# Patient Record
Sex: Female | Born: 1950 | Race: White | Hispanic: No | Smoking: Current every day smoker
Health system: Southern US, Community
[De-identification: ages and names within clinical notes are randomized; demographics above are authoritative.]

## PROBLEM LIST (undated history)

## (undated) DIAGNOSIS — I2510 Atherosclerotic heart disease of native coronary artery without angina pectoris: Secondary | ICD-10-CM

## (undated) DIAGNOSIS — I671 Cerebral aneurysm, nonruptured: Secondary | ICD-10-CM

## (undated) DIAGNOSIS — K219 Gastro-esophageal reflux disease without esophagitis: Secondary | ICD-10-CM

## (undated) DIAGNOSIS — I10 Essential (primary) hypertension: Secondary | ICD-10-CM

## (undated) DIAGNOSIS — R0600 Dyspnea, unspecified: Secondary | ICD-10-CM

## (undated) DIAGNOSIS — M069 Rheumatoid arthritis, unspecified: Secondary | ICD-10-CM

## (undated) DIAGNOSIS — I200 Unstable angina: Secondary | ICD-10-CM

## (undated) DIAGNOSIS — I314 Cardiac tamponade: Secondary | ICD-10-CM

## (undated) DIAGNOSIS — Z9641 Presence of insulin pump (external) (internal): Secondary | ICD-10-CM

## (undated) DIAGNOSIS — F419 Anxiety disorder, unspecified: Secondary | ICD-10-CM

## (undated) DIAGNOSIS — L97509 Non-pressure chronic ulcer of other part of unspecified foot with unspecified severity: Secondary | ICD-10-CM

## (undated) DIAGNOSIS — I509 Heart failure, unspecified: Secondary | ICD-10-CM

## (undated) DIAGNOSIS — F32A Depression, unspecified: Secondary | ICD-10-CM

## (undated) DIAGNOSIS — K759 Inflammatory liver disease, unspecified: Secondary | ICD-10-CM

## (undated) DIAGNOSIS — E119 Type 2 diabetes mellitus without complications: Secondary | ICD-10-CM

## (undated) DIAGNOSIS — G709 Myoneural disorder, unspecified: Secondary | ICD-10-CM

## (undated) DIAGNOSIS — M86172 Other acute osteomyelitis, left ankle and foot: Secondary | ICD-10-CM

## (undated) HISTORY — PX: CARDIAC CATHETERIZATION: SHX172

## (undated) HISTORY — PX: TONSILLECTOMY: SUR1361

## (undated) HISTORY — PX: COLONOSCOPY: SHX174

## (undated) HISTORY — PX: PARTIAL HYSTERECTOMY: SHX80

---

## 1998-04-05 ENCOUNTER — Ambulatory Visit (HOSPITAL_COMMUNITY): Admission: RE | Admit: 1998-04-05 | Discharge: 1998-04-05 | Payer: Self-pay | Admitting: Gastroenterology

## 1999-04-26 ENCOUNTER — Emergency Department (HOSPITAL_COMMUNITY): Admission: EM | Admit: 1999-04-26 | Discharge: 1999-04-26 | Payer: Self-pay | Admitting: Emergency Medicine

## 1999-05-17 ENCOUNTER — Encounter: Payer: Self-pay | Admitting: Gastroenterology

## 1999-05-17 ENCOUNTER — Ambulatory Visit (HOSPITAL_COMMUNITY): Admission: RE | Admit: 1999-05-17 | Discharge: 1999-05-17 | Payer: Self-pay | Admitting: Gastroenterology

## 1999-06-06 ENCOUNTER — Ambulatory Visit (HOSPITAL_COMMUNITY): Admission: RE | Admit: 1999-06-06 | Discharge: 1999-06-06 | Payer: Self-pay | Admitting: Gastroenterology

## 2000-04-26 ENCOUNTER — Emergency Department (HOSPITAL_COMMUNITY): Admission: EM | Admit: 2000-04-26 | Discharge: 2000-04-26 | Payer: Self-pay | Admitting: Internal Medicine

## 2000-04-26 ENCOUNTER — Encounter: Payer: Self-pay | Admitting: Internal Medicine

## 2002-10-23 ENCOUNTER — Encounter: Payer: Self-pay | Admitting: Family Medicine

## 2002-10-23 ENCOUNTER — Ambulatory Visit (HOSPITAL_COMMUNITY): Admission: RE | Admit: 2002-10-23 | Discharge: 2002-10-23 | Payer: Self-pay | Admitting: Family Medicine

## 2005-02-28 ENCOUNTER — Ambulatory Visit: Payer: Self-pay | Admitting: Family Medicine

## 2005-04-11 ENCOUNTER — Ambulatory Visit: Payer: Self-pay | Admitting: Family Medicine

## 2005-05-09 ENCOUNTER — Ambulatory Visit: Payer: Self-pay | Admitting: Family Medicine

## 2005-07-06 ENCOUNTER — Ambulatory Visit: Payer: Self-pay | Admitting: Family Medicine

## 2005-08-17 ENCOUNTER — Ambulatory Visit: Payer: Self-pay | Admitting: Family Medicine

## 2005-08-17 ENCOUNTER — Ambulatory Visit (HOSPITAL_COMMUNITY): Admission: RE | Admit: 2005-08-17 | Discharge: 2005-08-17 | Payer: Self-pay | Admitting: Family Medicine

## 2013-05-19 ENCOUNTER — Ambulatory Visit (INDEPENDENT_AMBULATORY_CARE_PROVIDER_SITE_OTHER): Payer: Medicare Other

## 2013-05-19 DIAGNOSIS — R209 Unspecified disturbances of skin sensation: Secondary | ICD-10-CM

## 2013-05-19 NOTE — Procedures (Signed)
  HISTORY:  Nichole Santiago is a 62 year old patient with a history of a stroke of associated with left-sided numbness. The patient has paresthesias as well in the legs, and a history of diabetes. The patient is being evaluated for a possible peripheral neuropathy.   NERVE CONDUCTION STUDIES:  Nerve conduction studies were performed on both upper extremities. The distal motor latencies and motor amplitudes for the median and ulnar nerves were within normal limits. The F wave latencies and nerve conduction velocities for these nerves were also normal. The sensory latencies for the median and ulnar nerves were normal.  Nerve conduction studies were performed on the left lower extremity. The distal motor latencies and motor amplitudes for the peroneal and posterior tibial nerves were within normal limits. The nerve conduction velocities for these nerves were also normal. The H reflex latency was normal. The sensory latency for the peroneal nerve was within normal limits.   EMG STUDIES:  EMG evaluation was not performed.  IMPRESSION:  Nerve conduction studies done on both upper extremities and on the left lower extremity were unremarkable. There is no evidence of a peripheral neuropathy. A small fiber neuropathy however, may be missed by a standard nerve conduction studies. Clinical correlation is required. EMG evaluation was not performed.  Marlan Palau MD 05/19/2013 2:35 PM  [facility] [address] [place], [place] [address]  Phone [phone] Fax [phone]

## 2015-09-12 HISTORY — PX: CEREBRAL ANEURYSM REPAIR: SHX164

## 2017-12-25 ENCOUNTER — Ambulatory Visit: Payer: Medicare Other | Admitting: Internal Medicine

## 2018-02-01 ENCOUNTER — Encounter

## 2018-02-01 ENCOUNTER — Ambulatory Visit: Payer: Medicare Other | Admitting: Internal Medicine

## 2018-02-01 ENCOUNTER — Encounter: Payer: Self-pay | Admitting: Internal Medicine

## 2018-02-01 VITALS — BP 90/58 | HR 82 | Ht 61.0 in | Wt 133.4 lb

## 2018-02-01 DIAGNOSIS — E1059 Type 1 diabetes mellitus with other circulatory complications: Secondary | ICD-10-CM | POA: Diagnosis not present

## 2018-02-01 DIAGNOSIS — E1065 Type 1 diabetes mellitus with hyperglycemia: Secondary | ICD-10-CM

## 2018-02-01 DIAGNOSIS — IMO0002 Reserved for concepts with insufficient information to code with codable children: Secondary | ICD-10-CM | POA: Insufficient documentation

## 2018-02-01 MED ORDER — GLUCAGON (RDNA) 1 MG IJ KIT: 1.0000 mg | PACK | Freq: Once | INTRAMUSCULAR | 12 refills | Status: DC | PRN

## 2018-02-01 NOTE — Patient Instructions (Addendum)
Please change the pump settings as follows (while on Prednisone) - basal rates: 12 am: 0.6 units/h 5 pm: 0.6 >> 0.8 - ICR: 1:16 >> 1:12 - target: 100-120 - ISF: 32 (you may need to decrease to 25 if sugars are not improved) - Insulin on Board: 4h - bolus wizard: on  Please enter: sugars, carbs and start the insulin bolus 15 min before every meals.   Please schedule an appt with Oran Rein with nutrition.  Please come back for a follow-up appointment in 3 months.  Basic Rules for Patients with Type I Diabetes Mellitus  1. The American Diabetes Association (ADA) recommended targets: - fasting sugar <130 - after meal sugar <180 - HbA1C <7%  2. Engage in ?150 min moderate exercise per week  3. Make sure you have ?8h of sleep every night as this helps both blood sugars and your weight.  4. Always keep a sugar log (not only record in your meter) and bring it to all appointments with [place].  5. If you are on a pump, know how to access the settings and to modify the parameters.  6.  Remember, you can always call the number on the back of the pump for emergencies related to the pump.  7. "15-15 rule" for hypoglycemia: if sugars are low, take 15 g of carbs** ("fast sugar" - e.g. 4 glucose tablets, 4 oz orange juice), wait 15 min, then check sugars again. If still <80, repeat. Continue  until your sugars >80, then eat a normal meal.   8. Teach family members and coworkers to inject glucagon. Have a glucagon set at home and one at work. They should call 911 after using the set.  9. If you are on a pump, set "insulin on board" time for 5 hours (if your sugars tend to be higher, can use 4 hours).   10. If you are on a pump, use the "dual wave bolus" setting for high fat foods (e.g. pizza). Start with a setting of 50%-50% (50% instant bolus and 50% prolonged bolus over 3h, for e.g.).    11. If you are on a pump, make sure the basal daily insulin dose is approximately equal (not larger) to  the daily insulin you get from boluses, otherwise you are at risk for hypoglycemia.  12. Check sugar before driving. If <100, correct, and only start driving if sugars rise ?786. Check sugar every hour when on a long drive.  13. Check sugar before exercising. If <100, correct, and only start exercising if sugars rise ?100. Check sugar every hour when on a long exercise routine and 1h after you finished exercising.   If >250, check urine for ketones. If you have moderate-large ketones in urine, do not start exercise. Hydrate yourself with clear liquids and correct the high sugar. Recheck sugars and ketones before attempting to exercise.  Be aware that you might need less insulin when exercising.  *intense, short, exercise bursts can increase your sugars, but  *less intense, longer (>1h), exercise routines can decrease your sugars.  If you are on a pump, you might need to decrease your basal rate by 10% or more (or even disconnect your pump) while you exercise to prevent low sugars. Do not disconnect your pump by more than 3 hours at a time! You also might need to decrease your insulin bolus for the meal prior to your exercise time by 20% or more.  14. Make sure you have a MedAlert bracelet or pendant mentioning "Type  I Diabetes Mellitus". If you have a prior episode of severe hypoglycemia or hypoglycemia unawareness, it should also mention this.  15. Please do not walk barefoot. Inspect your feet for sores/cuts and let us know if you have them.  16. Please call [facility] with any questions and concerns [phone]).   **E.g. of "fast carbs": ? first choice (15 g):  1 tube glucose gel, GlucoPouch 15, 2 oz glucose liquid ? second choice (15-16 g):  3 or 4 glucose tablets (best taken  with water), 15 Dextrose Bits chewable ? third choice (15-20 g):   cup fruit juice,  cup regular soda, 1 cup skim milk,  1 cup sports drink ? fourth choice (15-20 g):  1 small tube [place]  gel (not frosting), 2 tbsp raisins, 1 tbsp table sugar,  candy, jelly beans, gum drops - check package for carb amount   (adapted from: Juluis Rainier. "Insulin therapy and hypoglycemia" Endocrinol Metab Clin N Am 2012, 41: 57-87)

## 2018-02-01 NOTE — Progress Notes (Signed)
Patient ID: Nichole Santiago, female   DOB: 01-16-1951, 67 y.o.   MRN: [id number]   HPI: Nichole Santiago is a 67 y.o.-year-old female, referred by her PCP, Dr. 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  She previously saw Dr. Steffanie Dunn and then Dr. Francetta Found.  Last visit with him in 09/2017.  She is here with her daughter who offers part of the history especially regarding patient's symptoms and pump management.  Last hemoglobin A1c was: 01/30/2018: HbA1c 8.9% 09/19/2017: HbA1c 10% No results found for: HGBA1C  Pt is on an insulin pump - intiially t:slim, now Omnipod  - since Spring 2018, with a Dexcom CGM 5, uses Humalog in the pump.  Currently on 10 mg Prednisone for RA >> may stop in 02/2018, but unclear  Pump settings: - basal rates: 12 am: 0.6 units/h - ICR: 1:16 - target: 100-120 - ISF: 32 - Insulin on Board: 4h - bolus wizard: on TDD from basal insulin: 13.5 units (35%). TDD from bolus insulin: 24.9 units (65%) TDD: 30 units - extended bolusing: not using - changes infusion site: q3 days  Meter: One Touch Verio  Pt checks her sugars many times with her CGM - am: 172, 238 - 2h after b'fast: 248 - before lunch: 103- 224 - 2h after lunch: 92-297 - before dinner: 106-360, 400 - 2h after dinner: 192-400 - bedtime: 108, 160, 197-367 - nighttime: 71- 311 Lowest sugar was 50 (more carbs entered in the pump that she actually ate); she has hypoglycemia awareness at 60s. No previous hypoglycemia admission.  She does have a glucagon kit at home. Highest sugar was 400 (Prednisone - for RA), OTW 200s . No previous DKA admissions.    Pt's meals are: - Breakfast: sausage biscuit  + coffee - Lunch: sandwich, chips - Dinner: microwave dinner - Snacks: crackers, chips, banana - 3x a day  - no CKD, last  BUN/creatinine:  09/30/2017: 24/0.7, GFR 91 No results found for: BUN, CREATININE  On losartan 25.  -+ HL; last set of lipids: 08/01/2017: 287/189/59/190 06/05/2013: 347/310/74/211 No results found for: CHOL, HDL, LDLCALC, LDLDIRECT, TRIG, CHOLHDL  On Rosuvastatin 40.  - last eye exam was in 2017. No DR. + glaucoma.  - + numbness and tingling in her feet.  Was on Neurontin 300 mg twice a day >> stopped 2/2 dizziness. On ASA 81.  Last TSH: 05/30/2017: TSH 1.47 06/01/2014: TSH 0.954 No results found for: TSH  Pt has FH of DM in maternal grandmother.  She also has a history of RA.  ROS: Constitutional: + Fatigue, + increased appetite, + weight gain, + poor sleep, + nocturia, + increased urination Eyes: + Blurry vision, no xerophthalmia ENT: no sore throat, no nodules palpated in throat, no dysphagia/odynophagia, no hoarseness Cardiovascular: no CP/SOB/palpitations/+ leg swelling Respiratory:  + cough/no SOB Gastrointestinal: no N/V/D/C Musculoskeletal: + Both muscle/joint aches Skin: no rashes, + easy bruising, + hair loss Neurological: + Tremors/numbness/tingling/dizziness, + headache Psychiatric: no depression/anxiety + Low libido  Past medical history: - DM1 - HTN - HL  + see HPI  Social History   Socioeconomic History  . Marital status:  Married    Spouse name: Not on file  . Number of children: 2  Occupational History  .  Retired  Tobacco Use  . Smoking status:  Current smoker, 5 cigarettes a day  Substance and  Sexual Activity  . Alcohol use: No  . Drug use: No   Current Outpatient Medications  Medication Sig Dispense Refill  . clonazePAM (KLONOPIN) 0.5 MG tablet Take 1 tablet by mouth 2 (two) times daily.    . clopidogrel (PLAVIX) 75 MG tablet Take 1 tablet by mouth daily.    . diclofenac (VOLTAREN) 75 MG EC tablet Take 1 tablet by mouth 3 (three) times daily.    Marland Kitchen glucose blood (ONETOUCH VERIO) test strip TEST 6 TIMES DAILY    . metoprolol succinate  (TOPROL-XL) 25 MG 24 hr tablet Take 0.5 tablets by mouth every morning.    Glory Rosebush DELICA LANCETS 35K MISC by Does not apply route.    Marland Kitchen HYDROmorphone (DILAUDID) 2 MG tablet Take 2 mg by mouth every 4 (four) hours as needed.    Marland Kitchen losartan (COZAAR) 25 MG tablet Take 1 tablet by mouth daily.     No current facility-administered medications for this visit.     Allergies not on file No family history on file.  PE: BP (!) 90/58   Pulse 82   Ht 5' 1"  (1.549 m)   Wt 133 lb 6.4 oz (60.5 kg)   SpO2 94%   BMI 25.21 kg/m  Wt Readings from Last 3 Encounters:  02/01/18 133 lb 6.4 oz (60.5 kg)   Constitutional: Normal weight, in NAD, in wheelchair Eyes: PERRLA, EOMI, no exophthalmos ENT: moist mucous membranes, no thyromegaly, no cervical lymphadenopathy Cardiovascular: RRR, No MRG Respiratory: CTA B Gastrointestinal: abdomen soft, NT, ND, BS+ Musculoskeletal: no deformities, strength intact in all 4 Skin: moist, warm, no rashes Neurological: no tremor with outstretched hands, DTR normal in all 4  ASSESSMENT: 1. DM1, uncontrolled, with complications - CAD-s/p DES to LAD 06/2014 - cerebrovascular disease-s/p TIA - diabetic ulcer + osteomyelitis - microalbuminuria - PN  PLAN:  1. Patient with long-standing, uncontrolled DM1, on Omnipod insulin pump therapy.  We reviewed together her previous HbA1c levels, and the last level has improved per check 2 days ago by PCP.  She mentions that her daughter is helping her a lot with the pump management and this is the reason why her sugars improved. -And I explained that she is getting we reviewed together with patient and her daughter the pump downloads more insulin from the boluses them from the basal rates, which can be adequate if she is on prednisone.  However, I feel that she would benefit from higher basal rates especially in the second half of the day since her sugars appear to increase as the day goes by. -Another problem is that she is  not entering carbs and bolusing with every meal and I strongly advised her to start doing so.  Since she is unsure on her carb counting, I referred her for a refresher carb counting with our nutritionist. -Also, since she is on prednisone, I feel that she would benefit from a lower insulin to carb ratio so that she does not develop postprandial hyperglycemia -I advised her to try to start all the boluses 15 minutes before meals -She does a good job changing her infusion site every 3 days - We discussed about changes to her insulin regimen, as follows:  Patient Instructions   Please change the pump settings as follows (while on Prednisone) - basal rates: 12 am: 0.6 units/h 5 pm: 0.6 >> 0.8 - ICR: 1:16 >> 1:12 - target: 100-120 - ISF: 32 (you may need to decrease to 25 if sugars are not improved) -  Insulin on Board: 4h - bolus wizard: on  Please enter: sugars, carbs and start the insulin bolus 15 min before every meals.   Please schedule an appt with Antonieta Iba with nutrition.  Please come back for a follow-up appointment in 3 months.  - Continue to checking sugars at different times of the day - check at least 4 times a day, rotating checks - given foot care handout and explained the principles  - given instructions for hypoglycemia management "15-15 rule"  - advised for yearly eye exams - sent glucagon kit Rx to pharmacy - advised to get ketone strips - advised to always have Glu tablets with her - advised for a Med-alert bracelet mentioning "type 1 diabetes mellitus". - given instruction Re: exercising and driving in DM1 (pt instructions) - no signs of other autoimmune disorders - Return to clinic in 3 mo  Philemon Kingdom, MD PhD [facility]

## 2018-02-08 ENCOUNTER — Ambulatory Visit: Payer: Medicare Other | Admitting: Dietician

## 2018-06-06 ENCOUNTER — Ambulatory Visit: Payer: Medicare Other | Admitting: Internal Medicine

## 2018-06-06 DIAGNOSIS — Z0289 Encounter for other administrative examinations: Secondary | ICD-10-CM

## 2019-05-08 ENCOUNTER — Telehealth: Payer: Self-pay | Admitting: Internal Medicine

## 2019-05-08 NOTE — Telephone Encounter (Signed)
Noted  

## 2019-05-08 NOTE — Telephone Encounter (Signed)
Last office visit 02/01/18   Cancel/No-show? none  Future office visit scheduled? no

## 2019-05-08 NOTE — Telephone Encounter (Signed)
She was asked for follow-up.  Refills per PCP.

## 2019-05-08 NOTE — Telephone Encounter (Signed)
Pt called needing a Rx for insulin lispro injection. Please advise?  Pharmacy is [facility], [place]  Call pt @ [phone]. Thank you!

## 2019-10-18 ENCOUNTER — Emergency Department (HOSPITAL_COMMUNITY): Payer: Medicare Other

## 2019-10-18 ENCOUNTER — Emergency Department (HOSPITAL_COMMUNITY)
Admission: EM | Admit: 2019-10-18 | Discharge: 2019-10-19 | Disposition: A | Discharge status: Home / Self Care | Payer: Medicare Other | Attending: Emergency Medicine | Admitting: Emergency Medicine

## 2019-10-18 ENCOUNTER — Encounter: Payer: Self-pay | Admitting: Emergency Medicine

## 2019-10-18 DIAGNOSIS — F17210 Nicotine dependence, cigarettes, uncomplicated: Secondary | ICD-10-CM | POA: Diagnosis not present

## 2019-10-18 DIAGNOSIS — Z79899 Other long term (current) drug therapy: Secondary | ICD-10-CM | POA: Diagnosis not present

## 2019-10-18 DIAGNOSIS — Z7901 Long term (current) use of anticoagulants: Secondary | ICD-10-CM | POA: Insufficient documentation

## 2019-10-18 DIAGNOSIS — I2510 Atherosclerotic heart disease of native coronary artery without angina pectoris: Secondary | ICD-10-CM | POA: Diagnosis not present

## 2019-10-18 DIAGNOSIS — Z9641 Presence of insulin pump (external) (internal): Secondary | ICD-10-CM | POA: Insufficient documentation

## 2019-10-18 DIAGNOSIS — E1065 Type 1 diabetes mellitus with hyperglycemia: Secondary | ICD-10-CM | POA: Diagnosis not present

## 2019-10-18 DIAGNOSIS — R112 Nausea with vomiting, unspecified: Secondary | ICD-10-CM | POA: Diagnosis present

## 2019-10-18 DIAGNOSIS — E860 Dehydration: Secondary | ICD-10-CM

## 2019-10-18 HISTORY — DX: Type 2 diabetes mellitus without complications: E11.9

## 2019-10-18 HISTORY — DX: Rheumatoid arthritis, unspecified: M06.9

## 2019-10-18 HISTORY — DX: Presence of insulin pump (external) (internal): Z96.41

## 2019-10-18 HISTORY — DX: Cardiac tamponade: I31.4

## 2019-10-18 HISTORY — DX: Atherosclerotic heart disease of native coronary artery without angina pectoris: I25.10

## 2019-10-18 HISTORY — DX: Cerebral aneurysm, nonruptured: I67.1

## 2019-10-18 HISTORY — DX: Other acute osteomyelitis, left ankle and foot: M86.172

## 2019-10-18 HISTORY — DX: Unstable angina: I20.0

## 2019-10-18 HISTORY — DX: Non-pressure chronic ulcer of other part of unspecified foot with unspecified severity: L97.509

## 2019-10-18 LAB — CBG MONITORING, ED
Glucose-Capillary: 315 mg/dL — ABNORMAL HIGH (ref 70–99)
Glucose-Capillary: 299 mg/dL — ABNORMAL HIGH (ref 70–99)
Glucose-Capillary: 161 mg/dL — ABNORMAL HIGH (ref 70–99)

## 2019-10-18 LAB — CBC WITH DIFFERENTIAL/PLATELET
Abs Immature Granulocytes: 0.05 10*3/uL (ref 0.00–0.07)
Basophils Absolute: 0.1 10*3/uL (ref 0.0–0.1)
Basophils Relative: 1 %
Eosinophils Absolute: 0 10*3/uL (ref 0.0–0.5)
Eosinophils Relative: 0 %
HCT: 46.3 % — ABNORMAL HIGH (ref 36.0–46.0)
Hemoglobin: 15.1 g/dL — ABNORMAL HIGH (ref 12.0–15.0)
Immature Granulocytes: 1 %
Lymphocytes Relative: 11 %
Lymphs Abs: 1 10*3/uL (ref 0.7–4.0)
MCH: 31.8 pg (ref 26.0–34.0)
MCHC: 32.6 g/dL (ref 30.0–36.0)
MCV: 97.5 fL (ref 80.0–100.0)
Monocytes Absolute: 0.8 10*3/uL (ref 0.1–1.0)
Monocytes Relative: 9 %
Neutro Abs: 7.3 10*3/uL (ref 1.7–7.7)
Neutrophils Relative %: 78 %
Platelets: 138 10*3/uL — ABNORMAL LOW (ref 150–400)
RBC: 4.75 MIL/uL (ref 3.87–5.11)
RDW: 12.6 % (ref 11.5–15.5)
WBC: 9.3 10*3/uL (ref 4.0–10.5)
nRBC: 0 % (ref 0.0–0.2)

## 2019-10-18 LAB — BASIC METABOLIC PANEL
Anion gap: 14 (ref 5–15)
BUN: 21 mg/dL (ref 8–23)
CO2: 19 mmol/L — ABNORMAL LOW (ref 22–32)
Calcium: 8.8 mg/dL — ABNORMAL LOW (ref 8.9–10.3)
Chloride: 107 mmol/L (ref 98–111)
Creatinine, Ser: 0.83 mg/dL (ref 0.44–1.00)
GFR calc Af Amer: >60 mL/min (ref >60)
GFR calc non Af Amer: >60 mL/min (ref >60)
Glucose, Bld: 307 mg/dL — ABNORMAL HIGH (ref 70–99)
Potassium: 4.6 mmol/L (ref 3.5–5.1)
Sodium: 140 mmol/L (ref 135–145)

## 2019-10-18 LAB — URINALYSIS, ROUTINE W REFLEX MICROSCOPIC
Bacteria, UA: NONE SEEN
Bilirubin Urine: NEGATIVE
Glucose, UA: >=500 mg/dL — AB
Hgb urine dipstick: NEGATIVE
Ketones, ur: 20 mg/dL — AB
Leukocytes,Ua: NEGATIVE
Nitrite: NEGATIVE
Protein, ur: NEGATIVE mg/dL
Specific Gravity, Urine: 1.024 (ref 1.005–1.030)
pH: 5 (ref 5.0–8.0)

## 2019-10-18 LAB — COMPREHENSIVE METABOLIC PANEL
ALT: 19 U/L (ref 0–44)
AST: 19 U/L (ref 15–41)
Albumin: 3.6 g/dL (ref 3.5–5.0)
Alkaline Phosphatase: 49 U/L (ref 38–126)
Anion gap: 16 — ABNORMAL HIGH (ref 5–15)
BUN: 19 mg/dL (ref 8–23)
CO2: 19 mmol/L — ABNORMAL LOW (ref 22–32)
Calcium: 9.2 mg/dL (ref 8.9–10.3)
Chloride: 104 mmol/L (ref 98–111)
Creatinine, Ser: 0.86 mg/dL (ref 0.44–1.00)
GFR calc Af Amer: >60 mL/min (ref >60)
GFR calc non Af Amer: >60 mL/min (ref >60)
Glucose, Bld: 327 mg/dL — ABNORMAL HIGH (ref 70–99)
Potassium: 4.5 mmol/L (ref 3.5–5.1)
Sodium: 139 mmol/L (ref 135–145)
Total Bilirubin: 1.3 mg/dL — ABNORMAL HIGH (ref 0.3–1.2)
Total Protein: 7 g/dL (ref 6.5–8.1)

## 2019-10-18 LAB — BETA-HYDROXYBUTYRIC ACID: Beta-Hydroxybutyric Acid: 3.7 mmol/L — ABNORMAL HIGH (ref 0.05–0.27)

## 2019-10-18 LAB — BLOOD GAS, VENOUS
Acid-base deficit: 4.8 mmol/L — ABNORMAL HIGH (ref 0.0–2.0)
Bicarbonate: 19.4 mmol/L — ABNORMAL LOW (ref 20.0–28.0)
FIO2: 21
O2 Saturation: 52.3 %
Patient temperature: 36.8
pCO2, Ven: 39 mmHg — ABNORMAL LOW (ref 44.0–60.0)
pH, Ven: 7.332 (ref 7.250–7.430)
pO2, Ven: 31.7 mmHg — CL (ref 32.0–45.0)

## 2019-10-18 MED ORDER — DEXTROSE-NACL 5-0.45 % IV SOLN: INTRAVENOUS | Status: DC

## 2019-10-18 MED ORDER — DEXTROSE 50 % IV SOLN: 0.0000 mL | INTRAVENOUS | Status: DC | PRN

## 2019-10-18 MED ORDER — SODIUM CHLORIDE 0.9 % IV BOLUS
1000.0000 mL | Freq: Once | INTRAVENOUS | Status: AC
  Administered 2019-10-18: 1000 mL via INTRAVENOUS

## 2019-10-18 MED ORDER — INSULIN REGULAR(HUMAN) IN NACL 100-0.9 UT/100ML-% IV SOLN
INTRAVENOUS | Status: DC
  Administered 2019-10-18: 7 [IU]/h via INTRAVENOUS
  Filled 2019-10-18: qty 100

## 2019-10-18 MED ORDER — SODIUM CHLORIDE 0.9 % IV SOLN: INTRAVENOUS | Status: DC

## 2019-10-18 MED ORDER — SODIUM CHLORIDE 0.9 % IV BOLUS
1000.0000 mL | INTRAVENOUS | Status: AC
  Administered 2019-10-18 (×2): 1000 mL via INTRAVENOUS

## 2019-10-18 MED ORDER — ONDANSETRON HCL 4 MG/2ML IJ SOLN
4.0000 mg | Freq: Once | INTRAMUSCULAR | Status: AC
  Administered 2019-10-18: 4 mg via INTRAVENOUS
  Filled 2019-10-18: qty 2

## 2019-10-18 MED ORDER — POTASSIUM CHLORIDE 10 MEQ/100ML IV SOLN
10.0000 meq | INTRAVENOUS | Status: AC
  Administered 2019-10-18 (×2): 10 meq via INTRAVENOUS
  Filled 2019-10-18 (×3): qty 100

## 2019-10-18 MED ORDER — FENTANYL CITRATE (PF) 100 MCG/2ML IJ SOLN
25.0000 ug | Freq: Once | INTRAMUSCULAR | Status: AC
  Administered 2019-10-18: 25 ug via INTRAVENOUS
  Filled 2019-10-18: qty 2

## 2019-10-18 NOTE — ED Provider Notes (Addendum)
[facility] Provider Note   CSN: [id number] Arrival date & time: 10/18/19  1643     History Chief Complaint  Patient presents with  . Emesis  . Weakness    Nichole Santiago is a 69 y.o. female.  HPI      Nichole Santiago is a 69 y.o. female with hx of type 1 diabetes (with insulin pump), CAD, who presents to the Emergency Department complaining of severe nausea, vomiting for 2 days.  She noticed sharp pains in the right upper abdomen prior to onset of vomiting.  She reports having multiple episodes of vomiting and now dry heaves.  Unable to tolerate even very small amounts of water.  She also complains of loose stools and now "soreness" of her entire abdomen.  She denies fever, dysuria, hematochezia, chest pain or shortness of breath.  No recent abdominal surgeries.  Nothing makes her symptoms better or worse.     Past Medical History:  Diagnosis Date  . CAD (coronary artery disease)   . Cardiac tamponade   . Cardiac/pericardial tamponade   . Cerebral aneurysm   . Diabetes mellitus without complication ([place])   . Foot ulcer ([place])   . Insulin pump in place   . Osteomyelitis of ankle or foot, acute, left ([place])   . RA (rheumatoid arthritis) ([place])   . Unstable angina [facility])     Patient Active Problem List   Diagnosis Date Noted  . Uncontrolled type 1 diabetes mellitus with circulatory complication ([place]) 35/00/9381    No past surgical history on file.   OB History   No obstetric history on file.     No family history on file.  Social History   Tobacco Use  . Smoking status: Current Every Day Smoker    Packs/day: 0.25    Types: Cigarettes  . Smokeless tobacco: Never Used  Substance Use Topics  . Alcohol use: Not on file  . Drug use: Not on file    Home Medications Prior to Admission medications   Medication Sig Start Date End Date Taking? Authorizing Provider  clonazePAM (KLONOPIN) 0.5 MG tablet Take 1 tablet by mouth 2 (two) times daily.  09/24/17   [provider]  clopidogrel (PLAVIX) 75 MG tablet Take 1 tablet by mouth daily. 12/16/14   [provider]  diclofenac (VOLTAREN) 75 MG EC tablet Take 1 tablet by mouth 3 (three) times daily. 09/17/17   [provider]  glucagon (GLUCAGON EMERGENCY) 1 MG injection Inject 1 mg into the muscle once as needed for up to 1 dose. 02/01/18   Philemon Kingdom, MD  glucose blood (ONETOUCH VERIO) test strip TEST 6 TIMES DAILY 03/07/17   [provider]  HYDROmorphone (DILAUDID) 2 MG tablet Take 2 mg by mouth every 4 (four) hours as needed.    [provider]  losartan (COZAAR) 25 MG tablet Take 1 tablet by mouth daily. 01/22/18   [provider]  metoprolol succinate (TOPROL-XL) 25 MG 24 hr tablet Take 0.5 tablets by mouth every morning. 08/21/17   [provider]  Jonetta Speak LANCETS 82X MISC by Does not apply route. 09/02/13   [provider]    Allergies    Patient has no known allergies.  Review of Systems   Review of Systems  Constitutional: Positive for appetite change. Negative for chills and fever.  HENT: Negative for congestion, sore throat and trouble swallowing.   Respiratory: Negative for chest tightness and shortness of breath.  Cardiovascular: Negative for chest pain.  Gastrointestinal: Positive for abdominal pain, nausea and vomiting. Negative for blood in stool and diarrhea.  Endocrine: Positive for polydipsia.  Genitourinary: Positive for decreased urine volume. Negative for difficulty urinating, dysuria and flank pain.  Musculoskeletal: Positive for myalgias. Negative for back pain, neck pain and neck stiffness.  Skin: Negative for color change and rash.  Neurological: Positive for weakness. Negative for dizziness, syncope, speech difficulty, numbness and headaches.  Hematological: Negative for adenopathy.  Psychiatric/Behavioral: Negative for confusion.    Physical Exam Updated Vital Signs BP  (!) 150/78 (BP Location: Right Arm)   Pulse 98   Temp 97.7 F (36.5 C) (Oral)   Resp 12   Ht 5\' 1"  (1.549 m)   Wt 60.5 kg   SpO2 96%   BMI 25.20 kg/m   Physical Exam Vitals and nursing note reviewed.  Constitutional:      Appearance: She is not ill-appearing or toxic-appearing.     Comments: Pt appears older than stated age and uncomfortable appearing  HENT:     Mouth/Throat:     Mouth: Mucous membranes are dry.     Comments: Mucous membranes are very dry Eyes:     Extraocular Movements: Extraocular movements intact.     Conjunctiva/sclera: Conjunctivae normal.     Pupils: Pupils are equal, round, and reactive to light.  Cardiovascular:     Rate and Rhythm: Normal rate and regular rhythm.     Pulses: Normal pulses.  Pulmonary:     Effort: Pulmonary effort is normal.     Breath sounds: Normal breath sounds.  Abdominal:     Palpations: Abdomen is soft. There is no mass.     Tenderness: There is no abdominal tenderness. There is no guarding.  Musculoskeletal:        General: No tenderness.     Cervical back: Normal range of motion. No rigidity.     Right lower leg: No edema.     Left lower leg: No edema.  Skin:    General: Skin is warm.     Capillary Refill: Capillary refill takes less than 2 seconds.     Findings: No rash.  Neurological:     Mental Status: She is alert and oriented to person, place, and time.     GCS: GCS eye subscore is 4. GCS verbal subscore is 5. GCS motor subscore is 6.     Sensory: Sensation is intact. No sensory deficit.     Motor: Motor function is intact. No weakness.     Comments: CN II-XII intact.  Speech clear.  No pronator dift.  No facial weakness. Motor weakness     ED Results / Procedures / Treatments   Labs (all labs ordered are listed, but only abnormal results are displayed) Labs Reviewed  COMPREHENSIVE METABOLIC PANEL - Abnormal; Notable for the following components:      Result Value   CO2 19 (*)    Glucose, Bld 327 (*)     Total Bilirubin 1.3 (*)    Anion gap 16 (*)    All other components within normal limits  CBC WITH DIFFERENTIAL/PLATELET - Abnormal; Notable for the following components:   Hemoglobin 15.1 (*)    HCT 46.3 (*)    Platelets 138 (*)    All other components within normal limits  CBG MONITORING, ED - Abnormal; Notable for the following components:   Glucose-Capillary 315 (*)    All other components within normal limits  URINE CULTURE  URINALYSIS, ROUTINE W REFLEX MICROSCOPIC  BETA-HYDROXYBUTYRIC ACID  BETA-HYDROXYBUTYRIC ACID  BLOOD GAS, VENOUS  CBG MONITORING, ED    EKG EKG Interpretation  Date/Time:  Saturday October 18 2019 18:23:19 EST Ventricular Rate:  95 PR Interval:    QRS Duration: 102 QT Interval:  341 QTC Calculation: 429 R Axis:     Text Interpretation: Sinus rhythm Probable left atrial enlargement Abnormal R-wave progression, early transition Nonspecific T abnrm, anterolateral leads Since last tracing T wave abnormality is new Otherwise no significant change Confirmed by Mancel Bale [phone]) on 10/18/2019 7:05:59 PM   Radiology DG Chest Portable 1 View  Result Date: 10/18/2019 CLINICAL DATA:  Left-sided rib pain with weakness. EXAM: PORTABLE CHEST 1 VIEW COMPARISON:  08/17/2005. FINDINGS: The lungs are clear without focal pneumonia, edema, pneumothorax or pleural effusion. Interstitial markings are diffusely coarsened with chronic features. The cardiopericardial silhouette is within normal limits for size. The visualized bony structures of the thorax are intact. IMPRESSION: No active disease. Electronically Signed   By: Kennith Center M.D.   On: 10/18/2019 18:34    Procedures Procedures (including critical care time)  Medications Ordered in ED Medications  sodium chloride 0.9 % bolus 1,000 mL (has no administration in time range)  insulin regular, human (MYXREDLIN) 100 units/ 100 mL infusion (has no administration in time range)  0.9 %  sodium chloride infusion  (has no administration in time range)  dextrose 5 %-0.45 % sodium chloride infusion (has no administration in time range)  dextrose 50 % solution 0-50 mL (has no administration in time range)  sodium chloride 0.9 % bolus 1,000 mL (has no administration in time range)  potassium chloride 10 mEq in 100 mL IVPB (has no administration in time range)  ondansetron (ZOFRAN) injection 4 mg (has no administration in time range)    ED Course  I have reviewed the triage vital signs and the nursing notes.  Pertinent labs & imaging results that were available during my care of the patient were reviewed by me and considered in my medical decision making (see chart for details).   CRITICAL CARE Performed by: Eliazer Hemphill Total critical care time: 35 minutes Critical care time was exclusive of separately billable procedures and treating other patients. Critical care was necessary to treat or prevent imminent or life-threatening deterioration. Critical care was time spent personally by me on the following activities: development of treatment plan with patient and/or surrogate as well as nursing, discussions with consultants, evaluation of patient's response to treatment, examination of patient, obtaining history from patient or surrogate, ordering and performing treatments and interventions, ordering and review of laboratory studies, ordering and review of radiographic studies, pulse oximetry and re-evaluation of patient's condition.    MDM Rules/Calculators/A&P                       Type 1 diabetic here with previous abdominal pain, nausea, vomiting.  Mucous membranes appear dry.  Concerning for DKA.  Will obtain labs and IVF's.  No fever, tachycardia or tachypnea.  She reports having right upper abd pain prior to onset of vomiting, now improved.   pt has elevated anion gap, bicarb low but above 18.  BS also elevated, not hx of CHF, will give insulin and IVF's.  On recheck, pt is feeling better, N/V  improved.  BS also improving.  Will try oral fluid challenge and recheck BMP  Anion gap, BS improved.  No significant change to bicarb.  K+ stable.  Pt  has tolerated oral fluids, crackers and reports feeling much better.  Pt also seen by Dr. Effie Shy and care plan discussed.  Pt reports feeling ready for d/c home, no abdominal pain on exam, she reported RUQ pain prior to current sx's, no findings for infection or sepsis.  Pt agrees to return for scheduled abd [place] to evaluate for GB issue.  Strict return precautions also discussed.      Final Clinical Impression(s) / ED Diagnoses Final diagnoses:  Dehydration  Hyperglycemia due to type 1 diabetes mellitus [facility])    Rx / DC Orders ED Discharge Orders    None       Rosey Bath 10/20/19 1304    Mancel Bale, MD 10/20/19 2327    Pauline Aus, PA-C 10/30/19 2025    Mancel Bale, MD 11/01/19 [phone]

## 2019-10-18 NOTE — ED Notes (Signed)
Date and time results received: 10/18/19 1929 (use smartphrase ".now" to insert current time)  Test: PO2 Critical Value: 31.7  Name of Provider Notified: Effie Shy  Orders Received? Or Actions Taken?: provider notified

## 2019-10-18 NOTE — ED Notes (Signed)
Patient given water to drink and regular crackers to eat.

## 2019-10-18 NOTE — ED Provider Notes (Signed)
  Face-to-face evaluation   History: She presents for vomiting and abdominal pain.  Physical exam: Elderly, alert. Oral mucous membranes dry. No dysarthria or aphasia.    Medical screening examination/treatment/procedure(s) were conducted as a shared visit with non-physician practitioner(s) and myself.  I personally evaluated the patient during the encounter    Mancel Bale, MD 10/20/19 2338

## 2019-10-18 NOTE — ED Triage Notes (Signed)
Pt states that she has been vomiting for 2 days. She does not know what her sugar has been she can not keep anything down.

## 2019-10-19 ENCOUNTER — Inpatient Hospital Stay (HOSPITAL_COMMUNITY): Admit: 2019-10-19 | Payer: Medicare Other

## 2019-10-19 LAB — BASIC METABOLIC PANEL
Anion gap: 8 (ref 5–15)
BUN: 17 mg/dL (ref 8–23)
CO2: 18 mmol/L — ABNORMAL LOW (ref 22–32)
Calcium: 7.6 mg/dL — ABNORMAL LOW (ref 8.9–10.3)
Chloride: 109 mmol/L (ref 98–111)
Creatinine, Ser: 0.65 mg/dL (ref 0.44–1.00)
GFR calc Af Amer: >60 mL/min (ref >60)
GFR calc non Af Amer: >60 mL/min (ref >60)
Glucose, Bld: 196 mg/dL — ABNORMAL HIGH (ref 70–99)
Potassium: 4.5 mmol/L (ref 3.5–5.1)
Sodium: 135 mmol/L (ref 135–145)

## 2019-10-19 NOTE — ED Notes (Signed)
Patient asleep in room waiting for her daughter to pick her up.

## 2019-10-19 NOTE — Discharge Instructions (Addendum)
Be sure to drink plenty of water and monitor your blood sugar closely.  You are scheduled to return here today, (Sunday) at 10:00 am to have a ultrasound of your abdomen.  You will need to follow-up with your primary doctor in 2-3 days for recheck.

## 2019-10-19 NOTE — ED Notes (Signed)
This nurse spoke with pt daughter and informed that pt was up for discharge and is ready for transport.

## 2019-10-20 LAB — URINE CULTURE: Culture: NO GROWTH

## 2019-10-23 ENCOUNTER — Other Ambulatory Visit: Payer: Self-pay

## 2019-10-23 ENCOUNTER — Encounter: Payer: Self-pay | Admitting: Internal Medicine

## 2019-10-23 ENCOUNTER — Ambulatory Visit (INDEPENDENT_AMBULATORY_CARE_PROVIDER_SITE_OTHER): Payer: Medicare Other | Admitting: Internal Medicine

## 2019-10-23 DIAGNOSIS — IMO0002 Reserved for concepts with insufficient information to code with codable children: Secondary | ICD-10-CM

## 2019-10-23 DIAGNOSIS — E1065 Type 1 diabetes mellitus with hyperglycemia: Secondary | ICD-10-CM

## 2019-10-23 DIAGNOSIS — E1059 Type 1 diabetes mellitus with other circulatory complications: Secondary | ICD-10-CM | POA: Diagnosis not present

## 2019-10-23 MED ORDER — GLUCAGON EMERGENCY 1 MG IJ KIT: PACK | INTRAMUSCULAR | 11 refills | Status: AC

## 2019-10-23 MED ORDER — GLUCAGON (RDNA) 1 MG IJ KIT: 1.0000 mg | PACK | Freq: Once | INTRAMUSCULAR | 12 refills | Status: DC | PRN

## 2019-10-23 MED ORDER — INSULIN LISPRO 100 UNIT/ML ~~LOC~~ SOLN: SUBCUTANEOUS | 3 refills | Status: DC

## 2019-10-23 NOTE — Patient Instructions (Addendum)
Please continue the following pump settings: - basal rates: 12 am: 0.6 >> 0.7 units/h (consider 0.8 units/h) - ICR: 1:12 - target: 100-120 - ISF: 30 - Insulin on Board: 4h - bolus wizard: on  Please do the following approximately 15 minutes before every meal: - Enter carbs (C) - Enter sugars (S) - Start insulin bolus (I)  Please come back for a follow-up appointment in 3 months.

## 2019-10-23 NOTE — Progress Notes (Signed)
Patient ID: Nichole Santiago, female   DOB: 1951-07-16, 69 y.o.   MRN: [id number]   Patient location: Home My location: Office Persons participating in the virtual visit: patient, daughter, provider. Pt's daughter usually needs to accompany the pt. 2/2 Mild Cognitive Impairment.  Referring Provider: Chesley Noon, MD  I connected with the patient on 10/23/19 at  2:21 PM EST by a video enabled telemedicine application and verified that I am speaking with the correct person.   I discussed the limitations of evaluation and management by telemedicine and the availability of in person appointments. The patient expressed understanding and agreed to proceed.   Details of the encounter are shown below.  HPI: Nichole Santiago is a 69 y.o.-year-old female, referred by her PCP, Dr. 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  She previously saw Dr. Steffanie Santiago and then Dr. Francetta Santiago.  Last visit with him in 09/2017.  First visit with me was in 01/2018.  She was lost for follow-up afterwards.  She was admitted with dehydration, hyperglycemia and DKA on 10/18/2019 - Dexcom CGM malfunction.  Glucose was 327.  She had an increased beta hydroxybutyrate at that time per review of her hospital chart.  Reviewed HbA1c levels: 06/03/2019: HbA1c 9.9% 01/30/2018: HbA1c 8.9% 09/19/2017: HbA1c 10% No results Santiago for: HGBA1C  She is on insulin - intially t:slim, now Omnipod  - since Spring 2018, she has a Dexcom G6 CGM, uses Humalog in thshe has Humalog in the pump.  She was on 10 mg of prednisone for RA so we changed her pump settings accordingly -see below  Pump settings: - basal rates: 12 am: 0.6 units/h 5 pm: 0.6 - ICR: 1:16 >> 1:12 - target: 100-120 - ISF: 32 (you may need to decrease to 25 if sugars are not improved) >> 30 - Insulin on  Board: 4h - bolus wizard: on TDD from basal insulin: 13.5 units (35%) >> ? TDD from bolus insulin: 24.9 units (65%) >> ? TDD: ? units - extended bolusing: not using - changes infusion site: Every 3 days  Meter: One Touch Verio  She checks her sugars >4 times a day with her CGM (they changed her CGM to a G6 Dexcom): - am: 172, 238 >> 166-200 - 2h after b'fast: 248 >> ? - before lunch: 103- 224 >> 200 - 2h after lunch: 92-297 >> n/c - before dinner: 106-360, 400 >> 187 - 2h after dinner: 192-400 >> ? - bedtime: 108, 160, 197-367 >> 200 - nighttime: 71- 311 >> n/c Lowest sugar was 50 (more carbs entered in the pump that she actually ate) >> 155; she has hypoglycemia awareness at 60s. No previous hypoglycemia admission.  Her glucagon kit at home. Highest sugar was 400 (Prednisone - for RA), OTW 200s >> 300s .+ previous DKA admission - 10/2019  Pt's meals are: - Breakfast: sausage biscuit  + coffee - Lunch: sandwich, chips - Dinner: microwave dinner - Snacks: crackers, chips, banana - 3x a day  -No CKD, last BUN/creatinine:  Lab Results  Component Value Date   BUN 17 10/19/2019   BUN 21 10/18/2019   CREATININE 0.65 10/19/2019   CREATININE 0.83 10/18/2019  09/30/2017: 24/0.7, GFR 91 On losartan 25  -+ HL; last set of lipids: Per review of Dr. Fayrene Santiago notes: 04/29/2019: 177/96/83/75 08/01/2017: 287/189/59/190 06/05/2013: 347/310/74/211 No results Santiago for: CHOL, HDL, LDLCALC,  LDLDIRECT, TRIG, CHOLHDL  On rosuvastatin 40.  - last eye exam was in 2017: No DR, + glaucoma  -She has numbness and tingling in her feet.  Previously on Neurontin 300 mg twice a day but stopped due to dizziness. On ASA 81.  Reviewed TSH levels: 04/29/2019: TSH 1.36 05/30/2017: TSH 1.47 06/01/2014: TSH 0.954 No results Santiago for: TSH  Pt has FH of DM in maternal grandmother.  She also has a history of RA.  ROS: Constitutional: no weight gain/no weight loss, + fatigue, no subjective hyperthermia,  no subjective hypothermia, poor sleep, + nocturia Eyes: no blurry vision, no xerophthalmia ENT: no sore throat, no nodules palpated in neck, no dysphagia, no odynophagia, no hoarseness Cardiovascular: no CP/no SOB/no palpitations/no leg swelling Respiratory: no cough/no SOB/no wheezing Gastrointestinal: no N/no V/no D/no C/no acid reflux Musculoskeletal: + Muscle aches/+ joint aches Skin: no rashes, + hair loss Neurological: + Tremors/no numbness/no tingling/no dizziness, + headaches  I reviewed pt's medications, allergies, PMH, social hx, family hx, and changes were documented in the history of present illness. Otherwise, unchanged from my initial visit note.  Past medical history: - DM1 - HTN - HL  + see HPI  Social History   Socioeconomic History  . Marital status:  Married    Spouse name: Not on file  . Number of children: 2  Occupational History  .  Retired  Tobacco Use  . Smoking status:  Current smoker, 5 cigarettes a day  Substance and Sexual Activity  . Alcohol use: No  . Drug use: No   Current Outpatient Medications  Medication Sig Dispense Refill  . clonazePAM (KLONOPIN) 0.5 MG tablet Take 1 tablet by mouth 2 (two) times daily.    . clopidogrel (PLAVIX) 75 MG tablet Take 1 tablet by mouth daily.    . diclofenac (VOLTAREN) 75 MG EC tablet Take 1 tablet by mouth 3 (three) times daily.    Marland Kitchen glucose blood (ONETOUCH VERIO) test strip TEST 6 TIMES DAILY    . metoprolol succinate (TOPROL-XL) 25 MG 24 hr tablet Take 0.5 tablets by mouth every morning.    Glory Rosebush DELICA LANCETS 16X MISC by Does not apply route.    Marland Kitchen HYDROmorphone (DILAUDID) 2 MG tablet Take 2 mg by mouth every 4 (four) hours as needed.    Marland Kitchen losartan (COZAAR) 25 MG tablet Take 1 tablet by mouth daily.     No current facility-administered medications for this visit.     No Known Allergies No family history on file.  PE: There were no vitals taken for this visit. Wt Readings from Last 3  Encounters:  10/18/19 133 lb 6.1 oz (60.5 kg)  02/01/18 133 lb 6.4 oz (60.5 kg)   Constitutional:  in NAD  The physical exam was not performed (virtual visit).  ASSESSMENT: 1. DM1, uncontrolled, with complications - CAD-s/p DES to LAD 06/2014 - cerebrovascular disease-s/p TIA - diabetic ulcer + osteomyelitis - microalbuminuria - PN  2. HL  PLAN:  1. Patient with long history of type 1 diabetes, uncontrolled, on OmniPod insulin pump therapy, returning after long absence.  Few days ago she was admitted with DKA. -At last visit, she was here with her daughter who is helping her with pump management and her sugars were apparently improved.  At that time, she was on prednisone and we increased her insulin with meals by strengthening her insulin to carb ratios.  We also increased her basal rate after 5 PM and lowered her insulin sensitivity factor.  The sugars appear to be higher especially in the second half of the day at that time.  Also, at that time, she was not entering all the carbs and bolusing with every meal and I strongly advised her to start doing so.  I referred her to nutrition for carb counting refresher but she did not have this appointment. -At this visit, we could not download her pump since this was a virtual appointment, however, per her and her daughters report, sugars are higher than before at all times of the day.  They cannot give me many details about the blood sugars, but the daughter is trying to administer her boluses whenever she eats.  For now, we discussed about increasing the basal rate to 0.7 units an hour and even consider 0.8 units an hour in few days if sugars do not improve. -We again discussed about the importance of starting insulin boluses 15 minutes before a meal -I advised her to: Patient Instructions  Please continue the following pump settings: - basal rates: 12 am: 0.6 >> 0.7 units/h (consider 0.8 units/h) - ICR: 1:12 - target: 100-120 - ISF: 30 -  Insulin on Board: 4h - bolus wizard: on  Please do the following approximately 15 minutes before every meal: - Enter carbs (C) - Enter sugars (S) - Start insulin bolus (I)  Please come back for a follow-up appointment in 3 months.  - we will recheck her HbA1c when she returns to the clinic. - advised to check sugars at different times of the day - 4x a day, rotating check times - advised for yearly eye exams >> she is not UTD - return to clinic in 3 months  2. HL - Reviewed latest lipid panel from 04/2019: All fractions at goal, LDL much improved - Continues rosuvastatin 40 without side effects.  Philemon Kingdom, MD PhD [facility]

## 2019-10-24 ENCOUNTER — Ambulatory Visit (HOSPITAL_COMMUNITY)
Admission: RE | Admit: 2019-10-24 | Discharge: 2019-10-24 | Disposition: A | Discharge status: Home / Self Care | Payer: Medicare Other | Source: Ambulatory Visit | Attending: Emergency Medicine | Admitting: Emergency Medicine

## 2019-10-24 ENCOUNTER — Other Ambulatory Visit: Payer: Self-pay

## 2019-10-24 DIAGNOSIS — R1011 Right upper quadrant pain: Secondary | ICD-10-CM | POA: Diagnosis present

## 2019-10-24 DIAGNOSIS — K760 Fatty (change of) liver, not elsewhere classified: Secondary | ICD-10-CM | POA: Diagnosis not present

## 2019-10-24 DIAGNOSIS — N281 Cyst of kidney, acquired: Secondary | ICD-10-CM | POA: Insufficient documentation

## 2019-10-24 NOTE — ED Provider Notes (Signed)
This is a 69 year old female previously seen in the ED on 10/18/2019 for complaints of abdominal pain nausea vomiting.  Patient received symptomatic treatment during her hospitalization and felt better.  Due to reported right upper quadrant pain prior to the symptoms, patient was scheduled to have an abdominal ultrasound to evaluate for gallbladder issue.  Patient returns today per recommendation to have a limited upper quadrant abdominal ultrasound.  Ultrasound was performed showing no acute finding.  Information was given to patient and daughter who is at bedside.  Reassurance given. Recommend f/u with PCP for further care.   BP (!) 118/57   Pulse 90   Temp 97.7 F (36.5 C) (Oral)   Resp 20   Ht 5\' 1"  (1.549 m)   Wt 60.5 kg   SpO2 93%   BMI 25.20 kg/m   Results for orders placed or performed during the hospital encounter of 10/18/19  Urine culture   Specimen: Urine, Clean Catch  Result Value Ref Range   Specimen Description URINE, CLEAN CATCH    Special Requests      NONE Performed at [facility], [address] [place], [place] [address]    Culture NO GROWTH    Report Status 10/20/2019 FINAL   Comprehensive metabolic panel  Result Value Ref Range   Sodium 139 135 - 145 mmol/L   Potassium 4.5 3.5 - 5.1 mmol/L   Chloride 104 98 - 111 mmol/L   CO2 19 (L) 22 - 32 mmol/L   Glucose, Bld 327 (H) 70 - 99 mg/dL   BUN 19 8 - 23 mg/dL   Creatinine, Ser 0.86 0.44 - 1.00 mg/dL   Calcium 9.2 8.9 - 10.3 mg/dL   Total Protein 7.0 6.5 - 8.1 g/dL   Albumin 3.6 3.5 - 5.0 g/dL   AST 19 15 - 41 U/L   ALT 19 0 - 44 U/L   Alkaline Phosphatase 49 38 - 126 U/L   Total Bilirubin 1.3 (H) 0.3 - 1.2 mg/dL   GFR calc non Af Amer >60 >60 mL/min   GFR calc Af Amer >60 >60 mL/min   Anion gap 16 (H) 5 - 15  CBC with Differential  Result Value Ref Range   WBC 9.3 4.0 - 10.5 K/uL   RBC 4.75 3.87 - 5.11 MIL/uL   Hemoglobin 15.1 (H) 12.0 - 15.0 g/dL   HCT 46.3 (H) 36.0 - 46.0 %   MCV 97.5 80.0 -  100.0 fL   MCH 31.8 26.0 - 34.0 pg   MCHC 32.6 30.0 - 36.0 g/dL   RDW 12.6 11.5 - 15.5 %   Platelets 138 (L) 150 - 400 K/uL   nRBC 0.0 0.0 - 0.2 %   Neutrophils Relative % 78 %   Neutro Abs 7.3 1.7 - 7.7 K/uL   Lymphocytes Relative 11 %   Lymphs Abs 1.0 0.7 - 4.0 K/uL   Monocytes Relative 9 %   Monocytes Absolute 0.8 0.1 - 1.0 K/uL   Eosinophils Relative 0 %   Eosinophils Absolute 0.0 0.0 - 0.5 K/uL   Basophils Relative 1 %   Basophils Absolute 0.1 0.0 - 0.1 K/uL   Immature Granulocytes 1 %   Abs Immature Granulocytes 0.05 0.00 - 0.07 K/uL  Urinalysis, Routine w reflex microscopic  Result Value Ref Range   Color, Urine YELLOW YELLOW   APPearance CLEAR CLEAR   Specific Gravity, Urine 1.024 1.005 - 1.030   pH 5.0 5.0 - 8.0   Glucose, UA >=500 (A) NEGATIVE mg/dL  Hgb urine dipstick NEGATIVE NEGATIVE   Bilirubin Urine NEGATIVE NEGATIVE   Ketones, ur 20 (A) NEGATIVE mg/dL   Protein, ur NEGATIVE NEGATIVE mg/dL   Nitrite NEGATIVE NEGATIVE   Leukocytes,Ua NEGATIVE NEGATIVE   RBC / HPF 0-5 0 - 5 RBC/hpf   WBC, UA 0-5 0 - 5 WBC/hpf   Bacteria, UA NONE SEEN NONE SEEN   Squamous Epithelial / LPF 0-5 0 - 5   Mucus PRESENT    Hyaline Casts, UA PRESENT   Beta-hydroxybutyric acid  Result Value Ref Range   Beta-Hydroxybutyric Acid 3.70 (H) 0.05 - 0.27 mmol/L  Blood gas, venous  Result Value Ref Range   FIO2 21.00    pH, Ven 7.332 7.250 - 7.430   pCO2, Ven 39.0 (L) 44.0 - 60.0 mmHg   pO2, Ven 31.7 (LL) 32.0 - 45.0 mmHg   Bicarbonate 19.4 (L) 20.0 - 28.0 mmol/L   Acid-base deficit 4.8 (H) 0.0 - 2.0 mmol/L   O2 Saturation 52.3 %   Patient temperature 36.8   Basic metabolic panel  Result Value Ref Range   Sodium 140 135 - 145 mmol/L   Potassium 4.6 3.5 - 5.1 mmol/L   Chloride 107 98 - 111 mmol/L   CO2 19 (L) 22 - 32 mmol/L   Glucose, Bld 307 (H) 70 - 99 mg/dL   BUN 21 8 - 23 mg/dL   Creatinine, Ser 8.29 0.44 - 1.00 mg/dL   Calcium 8.8 (L) 8.9 - 10.3 mg/dL   GFR calc non Af  Amer >60 >60 mL/min   GFR calc Af Amer >60 >60 mL/min   Anion gap 14 5 - 15  Basic metabolic panel  Result Value Ref Range   Sodium 135 135 - 145 mmol/L   Potassium 4.5 3.5 - 5.1 mmol/L   Chloride 109 98 - 111 mmol/L   CO2 18 (L) 22 - 32 mmol/L   Glucose, Bld 196 (H) 70 - 99 mg/dL   BUN 17 8 - 23 mg/dL   Creatinine, Ser 9.37 0.44 - 1.00 mg/dL   Calcium 7.6 (L) 8.9 - 10.3 mg/dL   GFR calc non Af Amer >60 >60 mL/min   GFR calc Af Amer >60 >60 mL/min   Anion gap 8 5 - 15  CBG monitoring, ED  Result Value Ref Range   Glucose-Capillary 315 (H) 70 - 99 mg/dL  CBG monitoring, ED  Result Value Ref Range   Glucose-Capillary 299 (H) 70 - 99 mg/dL  CBG monitoring, ED  Result Value Ref Range   Glucose-Capillary 161 (H) 70 - 99 mg/dL   US Abdomen Complete  Result Date: 10/24/2019 CLINICAL DATA:  Abdominal pain and vomiting for 1 week EXAM: ABDOMEN ULTRASOUND COMPLETE COMPARISON:  None. FINDINGS: Gallbladder: No gallstones or wall thickening visualized. No sonographic Murphy sign noted by sonographer. Common bile duct: Diameter: 3.1 mm. Liver: Slight increased echogenicity is noted without focal mass. Portal vein is patent on color Doppler imaging with normal direction of blood flow towards the liver. IVC: No abnormality visualized. Pancreas: Well visualized due to overlying bowel gas. Spleen: Size and appearance within normal limits. Right Kidney: Length: 10.4 cm. Echogenicity within normal limits. No mass or hydronephrosis visualized. 2.5 cm cyst is noted in the midportion of the right kidney. Left Kidney: Length: 11.6 cm. Echogenicity within normal limits. No mass or hydronephrosis visualized. Abdominal aorta: No aneurysm visualized. Other findings: None. IMPRESSION: Right renal cyst. Mild fatty infiltration of the liver. No other focal abnormality is noted. Electronically Signed  By: Alcide Clever M.D.   On: 10/24/2019 09:06   DG Chest Portable 1 View  Result Date: 10/18/2019 CLINICAL DATA:   Left-sided rib pain with weakness. EXAM: PORTABLE CHEST 1 VIEW COMPARISON:  08/17/2005. FINDINGS: The lungs are clear without focal pneumonia, edema, pneumothorax or pleural effusion. Interstitial markings are diffusely coarsened with chronic features. The cardiopericardial silhouette is within normal limits for size. The visualized bony structures of the thorax are intact. IMPRESSION: No active disease. Electronically Signed   By: Kennith Center M.D.   On: 10/18/2019 18:34      Fayrene Helper, PA-C 10/24/19 [id number]    Blane Ohara, MD 10/24/19 1540

## 2019-12-18 ENCOUNTER — Other Ambulatory Visit: Payer: Self-pay

## 2019-12-18 ENCOUNTER — Encounter: Payer: Medicare Other | Attending: Internal Medicine | Admitting: Dietician

## 2019-12-18 DIAGNOSIS — E1065 Type 1 diabetes mellitus with hyperglycemia: Secondary | ICD-10-CM | POA: Diagnosis present

## 2019-12-18 DIAGNOSIS — E1059 Type 1 diabetes mellitus with other circulatory complications: Secondary | ICD-10-CM | POA: Diagnosis not present

## 2019-12-18 DIAGNOSIS — IMO0002 Reserved for concepts with insufficient information to code with codable children: Secondary | ICD-10-CM

## 2019-12-18 NOTE — Patient Instructions (Addendum)
Keep snacks in the kitchen. Choose a small amount of protein when you eat a carbohydrate. Unless treating a low blood sugar, all carbohydrates consumed should be entered into the pump.  Choose low fat meals and snacks.  Fat can cause problems with your digestion as well as make your blood sugar higher for longer.  Choose water.  Coffee and soda can make your stomach problems worse.  Recommend stopping smoking.  Walk daily.

## 2019-12-18 NOTE — Progress Notes (Signed)
Medical Nutrition Therapy:  Appt start time: 0100 end time:  1500.   Assessment:  Primary concerns today: .  Patient is here today with her daughter (due to mild cognitive impairment). They need a review of carbohydrate counting and daughter would like more information on gastroparesis. Previous Diabetes education was several years ago. No recent eye exam.  History includes Type 1 Diabetes, smoking.  Patient of Dr. Cruzita Lederer. Labs include A1C 9.9% 06/03/2019, eGFR >60  She uses an Omnipod 5.  Daughter states that she manages the pump most often.  [place] "sleep eats" and often carbohydrates are not being entered into the pump. Dexcom G6 (glucose was 160 today but she has not eaten all day) She experiences low blood glucose once per week and treats with regular coke.  Patient lives with her daughter and husband.  Her husband has Alzheimer's.  Patient's daughter does the shopping and no one cooks.   This is unlikely to change.  They use a lot of processed meals.  She does get Meals on Wheals 5 times per week and states that Meals on Wheals is now transitioning to hot meals. She is retired from [employer].  Preferred Learning Style:   No preference indicated   Learning Readiness:   Contemplating  MEDICATIONS: Humalog, vitamin D, prednisone, see list   DIETARY INTAKE: States that she is starving most all the time. Very tired of sandwiches.  She prefers to eat out daily (fast food). Loves salad but has not eaten recently. Dislikes red meat except bacon.  Likes fish and chicken. Her dentures hurt her and she does not like to wear them.  24-hr recall:  B (9  AM): skipped this am as out of biscuits- bacon, egg, and cheese biscuit, coffee with splenda  Snk ( AM): none  L ( PM): none today but usually 1 can of chunky vegetable soup OR sandwich (egg salad, chicken salad, PB&J on white bread) AND fruit cup Snk ( PM): chips, cookies, cashews D ( PM): Meals on Wheels Snk ( PM): chips, cookies,  occasional candy Beverages: "a lot of coffee", water only when blood sugar is very high, diet soda, diet tea  Usual physical activity: PT once per week but ending soon.  Walks around the house but no other activity.  Rarely gets out in the sun.   Progress Towards Goal(s):  In progress.   Nutritional Diagnosis:  NB-1.1 Food and nutrition-related knowledge deficit As related to balance of carbohydrate, protein, fat and carbohydrate counting.  As evidenced by diet hx and patient and daughter report.    Intervention:  Nutrition counseling and diabetes education initiated. Discussed Carb Counting by food group as method of portion control, reading food labels, and benefits of increased activity. Also, discussed basic physiology of Diabetes, target BG ranges pre and post meals, and A1c.  Encouraged stopping smoking, water rather than coffee and diet soda. Encouraged lower fat food choices and discussed implications of high food foods on gastroparesis as well as diabetes. Reminded them to enter all carbohydrates in the pump. Encouraged her that all snacks should be kept in the kitchen to try ot discourage "night/sleep eating".    Plan: Keep snacks in the kitchen. Choose a small amount of protein when you eat a carbohydrate. Unless treating a low blood sugar, all carbohydrates consumed should be entered into the pump.  Choose low fat meals and snacks.  Fat can cause problems with your digestion as well as make your blood sugar higher for longer.  Choose water.  Coffee and soda can make your stomach problems worse.  Recommend stopping smoking.  Walk daily.   Teaching Method Utilized:  Visual Auditory Hands on  Handouts given during visit include: How to Thrive:  A Guide for Your Journey with Diabetes by the ADA Food Label handouts Meal Plan Card  Gastroparesis nutrition therapy from [facility]  Barriers to learning/adherence to lifestyle change: memory deficit, lack of home  cooking  Demonstrated degree of understanding via:  Teach Back   Monitoring/Evaluation:  Dietary intake, exercise, label reading, and body weight prn.  Encouraged daughter to make an appointment with Dr. Cruzita Lederer and myself at discharge but appointments were not made.

## 2019-12-19 ENCOUNTER — Encounter: Payer: Self-pay | Admitting: Dietician

## 2020-02-06 ENCOUNTER — Ambulatory Visit: Payer: Medicare Other | Admitting: Internal Medicine

## 2020-05-24 ENCOUNTER — Ambulatory Visit: Payer: Medicare Other | Admitting: Internal Medicine

## 2020-07-20 ENCOUNTER — Ambulatory Visit: Payer: Medicare Other | Admitting: Internal Medicine

## 2021-04-11 IMAGING — DX DG CHEST 1V PORT
1 series · 1 of 1 positions shown · non-contrast
Comparison: 08/17/2005.

CLINICAL DATA: Left-sided rib pain with weakness.

EXAM:
PORTABLE CHEST 1 VIEW

[chest ap]
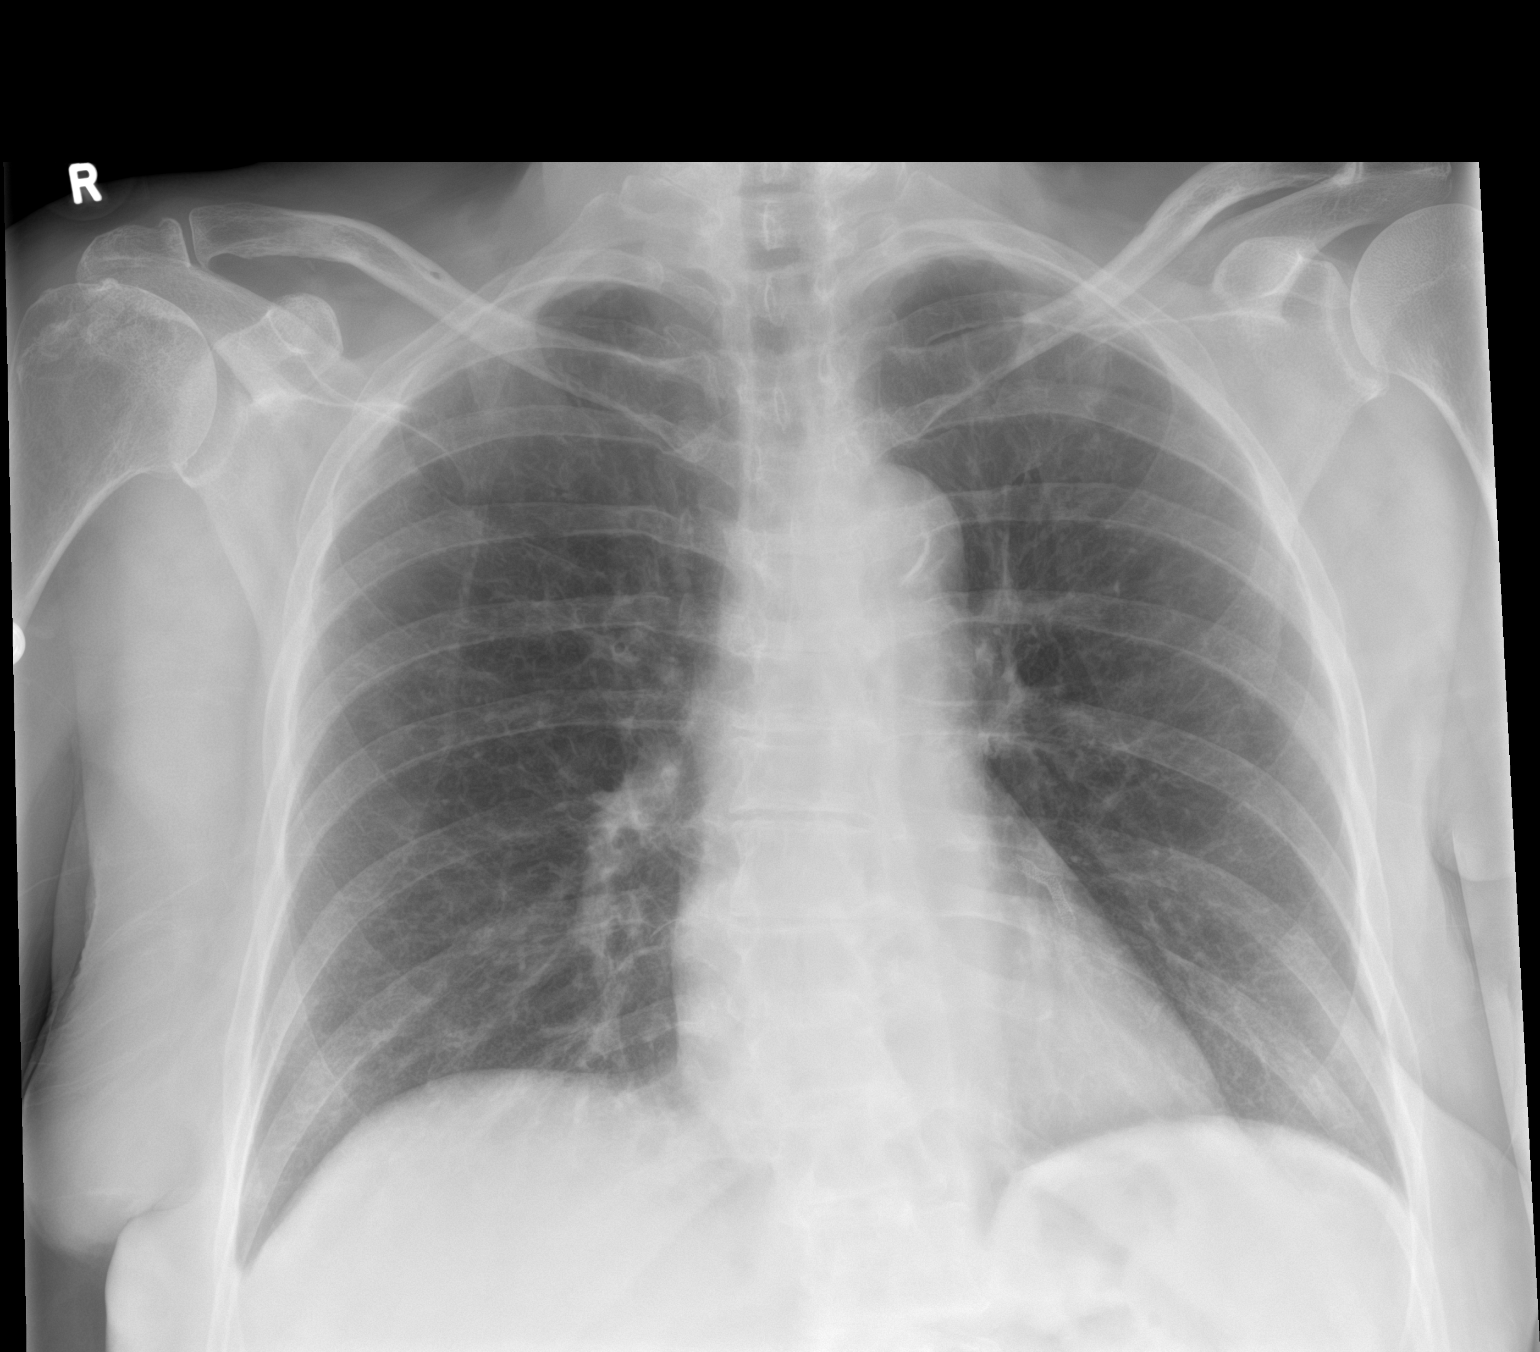

[1 of 1 positions shown; findings below may reference images not displayed]

FINDINGS: The lungs are clear without focal pneumonia, edema, pneumothorax or
pleural effusion. Interstitial markings are diffusely coarsened with
chronic features. The cardiopericardial silhouette is within normal
limits for size. The visualized bony structures of the thorax are
intact.
IMPRESSION: No active disease.

## 2021-12-15 ENCOUNTER — Emergency Department (HOSPITAL_BASED_OUTPATIENT_CLINIC_OR_DEPARTMENT_OTHER): Payer: Medicare Other

## 2021-12-15 ENCOUNTER — Encounter (HOSPITAL_BASED_OUTPATIENT_CLINIC_OR_DEPARTMENT_OTHER): Payer: Self-pay | Admitting: Emergency Medicine

## 2021-12-15 ENCOUNTER — Inpatient Hospital Stay (HOSPITAL_BASED_OUTPATIENT_CLINIC_OR_DEPARTMENT_OTHER)
Admission: EM | Admit: 2021-12-15 | Discharge: 2021-12-20 | DRG: 204 | Disposition: A | Discharge status: Home / Self Care | Payer: Medicare Other | Attending: Family Medicine | Admitting: Family Medicine

## 2021-12-15 ENCOUNTER — Other Ambulatory Visit: Payer: Self-pay

## 2021-12-15 DIAGNOSIS — E1069 Type 1 diabetes mellitus with other specified complication: Secondary | ICD-10-CM | POA: Diagnosis present

## 2021-12-15 DIAGNOSIS — Z20822 Contact with and (suspected) exposure to covid-19: Secondary | ICD-10-CM | POA: Diagnosis present

## 2021-12-15 DIAGNOSIS — R918 Other nonspecific abnormal finding of lung field: Secondary | ICD-10-CM | POA: Diagnosis not present

## 2021-12-15 DIAGNOSIS — M5134 Other intervertebral disc degeneration, thoracic region: Secondary | ICD-10-CM | POA: Diagnosis present

## 2021-12-15 DIAGNOSIS — Z681 Body mass index (BMI) 19 or less, adult: Secondary | ICD-10-CM

## 2021-12-15 DIAGNOSIS — I10 Essential (primary) hypertension: Secondary | ICD-10-CM | POA: Diagnosis present

## 2021-12-15 DIAGNOSIS — Z7902 Long term (current) use of antithrombotics/antiplatelets: Secondary | ICD-10-CM

## 2021-12-15 DIAGNOSIS — R54 Age-related physical debility: Secondary | ICD-10-CM | POA: Diagnosis present

## 2021-12-15 DIAGNOSIS — Z955 Presence of coronary angioplasty implant and graft: Secondary | ICD-10-CM

## 2021-12-15 DIAGNOSIS — F419 Anxiety disorder, unspecified: Secondary | ICD-10-CM | POA: Diagnosis present

## 2021-12-15 DIAGNOSIS — M069 Rheumatoid arthritis, unspecified: Secondary | ICD-10-CM | POA: Diagnosis present

## 2021-12-15 DIAGNOSIS — Z9641 Presence of insulin pump (external) (internal): Secondary | ICD-10-CM | POA: Diagnosis present

## 2021-12-15 DIAGNOSIS — R21 Rash and other nonspecific skin eruption: Secondary | ICD-10-CM | POA: Diagnosis not present

## 2021-12-15 DIAGNOSIS — R911 Solitary pulmonary nodule: Secondary | ICD-10-CM | POA: Diagnosis present

## 2021-12-15 DIAGNOSIS — R109 Unspecified abdominal pain: Secondary | ICD-10-CM | POA: Diagnosis present

## 2021-12-15 DIAGNOSIS — J432 Centrilobular emphysema: Secondary | ICD-10-CM | POA: Diagnosis present

## 2021-12-15 DIAGNOSIS — E785 Hyperlipidemia, unspecified: Secondary | ICD-10-CM | POA: Diagnosis present

## 2021-12-15 DIAGNOSIS — F17210 Nicotine dependence, cigarettes, uncomplicated: Secondary | ICD-10-CM | POA: Diagnosis present

## 2021-12-15 DIAGNOSIS — Z7960 Long term (current) use of unspecified immunomodulators and immunosuppressants: Secondary | ICD-10-CM

## 2021-12-15 DIAGNOSIS — Z7952 Long term (current) use of systemic steroids: Secondary | ICD-10-CM

## 2021-12-15 DIAGNOSIS — R7881 Bacteremia: Secondary | ICD-10-CM

## 2021-12-15 DIAGNOSIS — E860 Dehydration: Secondary | ICD-10-CM | POA: Diagnosis present

## 2021-12-15 DIAGNOSIS — D84821 Immunodeficiency due to drugs: Secondary | ICD-10-CM | POA: Diagnosis present

## 2021-12-15 DIAGNOSIS — Z7982 Long term (current) use of aspirin: Secondary | ICD-10-CM

## 2021-12-15 DIAGNOSIS — K769 Liver disease, unspecified: Secondary | ICD-10-CM | POA: Diagnosis present

## 2021-12-15 DIAGNOSIS — R64 Cachexia: Secondary | ICD-10-CM | POA: Diagnosis present

## 2021-12-15 DIAGNOSIS — I700 Atherosclerosis of aorta: Secondary | ICD-10-CM | POA: Diagnosis present

## 2021-12-15 DIAGNOSIS — I76 Septic arterial embolism: Principal | ICD-10-CM

## 2021-12-15 DIAGNOSIS — E119 Type 2 diabetes mellitus without complications: Secondary | ICD-10-CM

## 2021-12-15 DIAGNOSIS — M4804 Spinal stenosis, thoracic region: Secondary | ICD-10-CM | POA: Diagnosis present

## 2021-12-15 DIAGNOSIS — D696 Thrombocytopenia, unspecified: Secondary | ICD-10-CM | POA: Diagnosis not present

## 2021-12-15 DIAGNOSIS — R1011 Right upper quadrant pain: Secondary | ICD-10-CM | POA: Diagnosis not present

## 2021-12-15 DIAGNOSIS — I2510 Atherosclerotic heart disease of native coronary artery without angina pectoris: Secondary | ICD-10-CM | POA: Diagnosis present

## 2021-12-15 DIAGNOSIS — M869 Osteomyelitis, unspecified: Secondary | ICD-10-CM | POA: Diagnosis present

## 2021-12-15 DIAGNOSIS — I7090 Unspecified atherosclerosis: Secondary | ICD-10-CM

## 2021-12-15 LAB — COMPREHENSIVE METABOLIC PANEL
ALT: 9 U/L (ref 0–44)
AST: 13 U/L — ABNORMAL LOW (ref 15–41)
Albumin: 3.8 g/dL (ref 3.5–5.0)
Alkaline Phosphatase: 82 U/L (ref 38–126)
Anion gap: 11 (ref 5–15)
BUN: 13 mg/dL (ref 8–23)
CO2: 24 mmol/L (ref 22–32)
Calcium: 9.6 mg/dL (ref 8.9–10.3)
Chloride: 104 mmol/L (ref 98–111)
Creatinine, Ser: 0.49 mg/dL (ref 0.44–1.00)
GFR, Estimated: >60 mL/min (ref >60)
Glucose, Bld: 152 mg/dL — ABNORMAL HIGH (ref 70–99)
Potassium: 3.8 mmol/L (ref 3.5–5.1)
Sodium: 139 mmol/L (ref 135–145)
Total Bilirubin: 0.8 mg/dL (ref 0.3–1.2)
Total Protein: 7.2 g/dL (ref 6.5–8.1)

## 2021-12-15 LAB — URINALYSIS, ROUTINE W REFLEX MICROSCOPIC
Bilirubin Urine: NEGATIVE
Glucose, UA: NEGATIVE mg/dL
Hgb urine dipstick: NEGATIVE
Ketones, ur: NEGATIVE mg/dL
Nitrite: NEGATIVE
Specific Gravity, Urine: 1.041 — ABNORMAL HIGH (ref 1.005–1.030)
pH: 6 (ref 5.0–8.0)

## 2021-12-15 LAB — CBC
HCT: 39.3 % (ref 36.0–46.0)
Hemoglobin: 13 g/dL (ref 12.0–15.0)
MCH: 30.7 pg (ref 26.0–34.0)
MCHC: 33.1 g/dL (ref 30.0–36.0)
MCV: 92.9 fL (ref 80.0–100.0)
Platelets: 141 10*3/uL — ABNORMAL LOW (ref 150–400)
RBC: 4.23 MIL/uL (ref 3.87–5.11)
RDW: 13.7 % (ref 11.5–15.5)
WBC: 5.5 10*3/uL (ref 4.0–10.5)
nRBC: 0 % (ref 0.0–0.2)

## 2021-12-15 LAB — LACTIC ACID, PLASMA: Lactic Acid, Venous: 0.9 mmol/L (ref 0.5–1.9)

## 2021-12-15 LAB — LIPASE, BLOOD: Lipase: <10 U/L — ABNORMAL LOW (ref 11–51)

## 2021-12-15 MED ORDER — ONDANSETRON HCL 4 MG/2ML IJ SOLN
4.0000 mg | Freq: Once | INTRAMUSCULAR | Status: AC
  Administered 2021-12-15: 4 mg via INTRAVENOUS
  Filled 2021-12-15: qty 2

## 2021-12-15 MED ORDER — VANCOMYCIN HCL IN DEXTROSE 1-5 GM/200ML-% IV SOLN
1000.0000 mg | Freq: Once | INTRAVENOUS | Status: AC
  Administered 2021-12-15: 1000 mg via INTRAVENOUS
  Filled 2021-12-15: qty 200

## 2021-12-15 MED ORDER — SODIUM CHLORIDE 0.9 % IV BOLUS
1000.0000 mL | Freq: Once | INTRAVENOUS | Status: AC
Start: 2021-12-15 — End: 2021-12-15
  Administered 2021-12-15: 1000 mL via INTRAVENOUS

## 2021-12-15 MED ORDER — IOHEXOL 300 MG/ML  SOLN
100.0000 mL | Freq: Once | INTRAMUSCULAR | Status: AC | PRN
Start: 2021-12-15 — End: 2021-12-15
  Administered 2021-12-15: 100 mL via INTRAVENOUS

## 2021-12-15 MED ORDER — HYDROMORPHONE HCL 1 MG/ML IJ SOLN
1.0000 mg | Freq: Once | INTRAMUSCULAR | Status: AC
Start: 2021-12-15 — End: 2021-12-15
  Administered 2021-12-15: 1 mg via INTRAVENOUS
  Filled 2021-12-15: qty 1

## 2021-12-15 MED ORDER — IOHEXOL 350 MG/ML SOLN
100.0000 mL | Freq: Once | INTRAVENOUS | Status: AC | PRN
  Administered 2021-12-15: 60 mL via INTRAVENOUS

## 2021-12-15 MED ORDER — CEFTRIAXONE SODIUM 2 G IJ SOLR
2.0000 g | Freq: Once | INTRAMUSCULAR | Status: AC
Start: 2021-12-15 — End: 2021-12-15
  Administered 2021-12-15: 2 g via INTRAVENOUS
  Filled 2021-12-15: qty 20

## 2021-12-15 NOTE — ED Notes (Signed)
Provider made aware of previous note ?

## 2021-12-15 NOTE — ED Notes (Addendum)
CT tech reports to this RN that she noted pt spO2 87% when she returned her to monitor in room. RN to bedside. Pulse ox probe changed to ensure this was not related to low spO2 reading. 2L [place] applied, spO2 improved to 93%. Pt states that she is a smoker but does not wear a cpap or oxygen at home. Mentating well throughout. Lung sounds clear. ? ?Shared with primary RN, likely related to dilaudid admin. ?

## 2021-12-15 NOTE — ED Triage Notes (Signed)
Pt via pov from pcp office. She was sent here for evaluation of a mass in her gallbladder as found in ultrasound at [facility]. She has this result in her mychart.  ? ?Pain in abdomen has been intermittent for about 2 years. Pt alert & oriented, nad noted.  ?

## 2021-12-15 NOTE — ED Notes (Signed)
Called [facility] to transport patient to [facility] 6E room [phone] ?

## 2021-12-15 NOTE — ED Notes (Signed)
Pt's paperwork also mention a possible pericardial effusion but did not specify further. Pt denis Chest pain .  ?

## 2021-12-15 NOTE — ED Provider Notes (Signed)
?[facility] ?Provider Note ? ? ?CSN: [id number] ?Arrival date & time: 12/15/21  1626 ? ?  ? ?History ? ?Chief Complaint  ?Patient presents with  ? Abdominal Pain  ? ? ?Nichole Santiago is a 71 y.o. female. ? ?HPI ?Patient has medical history significant for insulin-dependent diabetes, cardiac disease with prior cardiac stent, rheumatoid arthritis, distant history of pericardial effusion with pericardial window.  Patient reports she has been having a lot of problem with pain in her right upper abdomen and flank area.  She places her hand across her low thoracic chest and upper abdomen.  She reports that it also has a significantly positional component to it.  She reports there are certain positions she gets into where the pain is intensely severe.  Other positions are somewhat more comfortable.  Patient reports she does feel short of breath at times but has not had a fever that she knows of.  She reports sometimes she does feel achy and with chills.  She has sporadically been getting vomiting for a few weeks.  She started having vomiting and dry heaves again this afternoon.  She had been sent to ultrasound by her PCP for possible biliary source.  On outpatient basis a concerning mass was identified for which she was referred to the emergency department for further evaluation. ? ?Patient has been a longtime smoker.  She has cut back to about 1/2 pack/day.  She denies any history of IV drug abuse.  She denies alcohol use.  She is currently living with her daughter who assists in her care. ?  ? ?Home Medications ?Prior to Admission medications   ?Medication Sig Start Date End Date Taking? Authorizing Provider  ?aspirin EC 81 MG tablet Take 81 mg by mouth daily.   Yes [provider]  ?insulin lispro (HUMALOG) 100 UNIT/ML injection Use up to 40 units a day in the insulin pump 10/23/19  Yes Philemon Kingdom, MD  ?leflunomide (ARAVA) 10 MG tablet Take 10 mg by mouth daily. 10/17/19  Yes  [provider]  ?losartan (COZAAR) 25 MG tablet Take 1 tablet by mouth daily. 01/22/18  Yes [provider]  ?metoprolol succinate (TOPROL-XL) 25 MG 24 hr tablet Take 0.5 tablets by mouth every morning. 08/21/17  Yes [provider]  ?Jonetta Speak LANCETS 45G MISC by Does not apply route. 09/02/13  Yes [provider]  ?pregabalin (LYRICA) 75 MG capsule Take 75 mg by mouth 2 (two) times daily. 07/16/19  Yes [provider]  ?rosuvastatin (CRESTOR) 40 MG tablet Take 40 mg by mouth at bedtime. 09/16/19  Yes [provider]  ?sertraline (ZOLOFT) 50 MG tablet Take 75 mg by mouth daily. 10/06/19  Yes [provider]  ?ALPRAZolam Duanne Moron) 0.5 MG tablet Take 0.5 mg by mouth 3 (three) times daily as needed for anxiety.    [provider]  ?clopidogrel (PLAVIX) 75 MG tablet Take 1 tablet by mouth daily. 12/16/14   [provider]  ?Glucagon, rDNA, (GLUCAGON EMERGENCY) 1 MG KIT Inject under skin 1 mg as needed for hypoglycemia 10/23/19   Philemon Kingdom, MD  ?glucose blood test strip TEST 6 TIMES DAILY 03/07/17   [provider]  ?ondansetron (ZOFRAN-ODT) 4 MG disintegrating tablet Take 4 mg by mouth every 8 (eight) hours as needed for nausea or vomiting.    [provider]  ?predniSONE (DELTASONE) 5 MG tablet Take 5 mg by mouth daily with breakfast.    [provider]  ?Vitamin D,  Ergocalciferol, (DRISDOL) 1.25 MG (50000 UNIT) CAPS capsule Take 50,000 Units by mouth once a week. 09/11/19   [provider]  ?   ? ?Allergies    ?Patient has no known allergies.   ? ?Review of Systems   ?Review of Systems ?10 systems reviewed and negative except as per HPI ?Physical Exam ?Updated Vital Signs ?BP 139/82   Pulse 73   Temp 98.9 ?F (37.2 ?C) (Oral)   Resp 16   Ht _0  (1.727 m)   Wt 59 kg   SpO2 95%   BMI 19.77 kg/m?  ?Physical Exam ?Constitutional:   ?   Comments: Patient is uncomfortable in appearance.  She is  lying on her right side.  She occasionally has dry heaves and will set up.  No respiratory distress at rest.  ?HENT:  ?   Mouth/Throat:  ?   Pharynx: Oropharynx is clear.  ?Eyes:  ?   Extraocular Movements: Extraocular movements intact.  ?Cardiovascular:  ?   Rate and Rhythm: Normal rate and regular rhythm.  ?Pulmonary:  ?   Effort: Pulmonary effort is normal.  ?   Breath sounds: Normal breath sounds.  ?Abdominal:  ?   Comments: Abdomen soft.  Moderate discomfort to palpation in the right upper quadrant.  No guarding.  No rash.  Positive CVA discomfort  ?Musculoskeletal:     ?   General: No swelling or tenderness. Normal range of motion.  ?   Right lower leg: No edema.  ?   Left lower leg: No edema.  ?   Comments: No peripheral edema.  Calves are soft and pliable  ?Skin: ?   General: Skin is warm and dry.  ?Neurological:  ?   General: No focal deficit present.  ?   Mental Status: She is oriented to person, place, and time.  ?   Coordination: Coordination normal.  ?Psychiatric:     ?   Mood and Affect: Mood normal.  ? ? ?ED Results / Procedures / Treatments   ?Labs ?(all labs ordered are listed, but only abnormal results are displayed) ?Labs Reviewed  ?LIPASE, BLOOD - Abnormal; Notable for the following components:  ?    Result Value  ? Lipase <10 (*)   ? All other components within normal limits  ?COMPREHENSIVE METABOLIC PANEL - Abnormal; Notable for the following components:  ? Glucose, Bld 152 (*)   ? AST 13 (*)   ? All other components within normal limits  ?CBC - Abnormal; Notable for the following components:  ? Platelets 141 (*)   ? All other components within normal limits  ?URINALYSIS, ROUTINE W REFLEX MICROSCOPIC - Abnormal; Notable for the following components:  ? Specific Gravity, Urine 1.041 (*)   ? Protein, ur TRACE (*)   ? Leukocytes,Ua TRACE (*)   ? All other components within normal limits  ?CULTURE, BLOOD (ROUTINE X 2)  ?CULTURE, BLOOD (ROUTINE X 2)  ?LACTIC ACID, PLASMA  ?LACTIC ACID, PLASMA   ? ? ?EKG ?None ? ?Radiology ?CT Angio Chest PE W/Cm &/Or Wo Cm ? ?Result Date: 12/15/2021 ?CLINICAL DATA:  Pulmonary embolism (PE) suspected, high prob. Pulmonary nodule. EXAM: CT ANGIOGRAPHY CHEST WITH CONTRAST TECHNIQUE: Multidetector CT imaging of the chest was performed using the standard protocol during bolus administration of intravenous contrast. Multiplanar CT image reconstructions and MIPs were obtained to evaluate the vascular anatomy. RADIATION DOSE REDUCTION: This exam was performed according to the departmental dose-optimization program which includes automated exposure control, adjustment of the mA  and/or kV according to patient size and/or use of iterative reconstruction technique. CONTRAST:  65m OMNIPAQUE IOHEXOL 350 MG/ML SOLN COMPARISON:  None. FINDINGS: Cardiovascular: There is adequate opacification of the pulmonary arterial tree. No intraluminal filling defect identified to suggest acute pulmonary embolism. The central pulmonary arteries are of normal caliber. Mild coronary artery calcification. Left anterior descending coronary artery stenting has been performed. The global cardiac size is within normal limits. No pericardial effusion. Moderate atherosclerotic calcification is seen within the thoracic aorta. No aortic aneurysm. Mediastinum/Nodes: No enlarged mediastinal, hilar, or axillary lymph nodes. Thyroid gland, trachea, and esophagus demonstrate no significant findings. Lungs/Pleura: Mild paraseptal and centrilobular emphysema. There are multiple bilateral basilar predominant pulmonary nodules, many of which are cavitary in nature. The largest of which is seen within the subpleural right lower lobe measuring 14 x 22 mm at axial image # 80/6. There are at least 15 such nodules scattered throughout the lungs, predominantly distributed within the subpleural right lower lobe. Differential considerations are led by necrotic pulmonary metastases and septic embolization. No confluent pulmonary  infiltrate. No central obstructing lesion. No pneumothorax or pleural effusion. Upper Abdomen: No acute abnormality. Musculoskeletal: Degenerative changes are seen throughout the thoracic spine. There are multiple rim c

## 2021-12-16 ENCOUNTER — Inpatient Hospital Stay (HOSPITAL_COMMUNITY): Payer: Medicare Other

## 2021-12-16 ENCOUNTER — Encounter (HOSPITAL_COMMUNITY): Payer: Self-pay | Admitting: Internal Medicine

## 2021-12-16 DIAGNOSIS — Z955 Presence of coronary angioplasty implant and graft: Secondary | ICD-10-CM | POA: Diagnosis not present

## 2021-12-16 DIAGNOSIS — E119 Type 2 diabetes mellitus without complications: Secondary | ICD-10-CM

## 2021-12-16 DIAGNOSIS — K769 Liver disease, unspecified: Secondary | ICD-10-CM | POA: Diagnosis not present

## 2021-12-16 DIAGNOSIS — Z7982 Long term (current) use of aspirin: Secondary | ICD-10-CM | POA: Diagnosis not present

## 2021-12-16 DIAGNOSIS — J432 Centrilobular emphysema: Secondary | ICD-10-CM | POA: Diagnosis present

## 2021-12-16 DIAGNOSIS — R008 Other abnormalities of heart beat: Secondary | ICD-10-CM

## 2021-12-16 DIAGNOSIS — E1069 Type 1 diabetes mellitus with other specified complication: Secondary | ICD-10-CM | POA: Diagnosis present

## 2021-12-16 DIAGNOSIS — R918 Other nonspecific abnormal finding of lung field: Secondary | ICD-10-CM

## 2021-12-16 DIAGNOSIS — Z7902 Long term (current) use of antithrombotics/antiplatelets: Secondary | ICD-10-CM | POA: Diagnosis not present

## 2021-12-16 DIAGNOSIS — M4804 Spinal stenosis, thoracic region: Secondary | ICD-10-CM | POA: Diagnosis present

## 2021-12-16 DIAGNOSIS — Z20822 Contact with and (suspected) exposure to covid-19: Secondary | ICD-10-CM | POA: Diagnosis present

## 2021-12-16 DIAGNOSIS — M069 Rheumatoid arthritis, unspecified: Secondary | ICD-10-CM | POA: Diagnosis present

## 2021-12-16 DIAGNOSIS — F17210 Nicotine dependence, cigarettes, uncomplicated: Secondary | ICD-10-CM | POA: Diagnosis present

## 2021-12-16 DIAGNOSIS — E860 Dehydration: Secondary | ICD-10-CM | POA: Diagnosis present

## 2021-12-16 DIAGNOSIS — D84821 Immunodeficiency due to drugs: Secondary | ICD-10-CM | POA: Diagnosis present

## 2021-12-16 DIAGNOSIS — D696 Thrombocytopenia, unspecified: Secondary | ICD-10-CM | POA: Diagnosis not present

## 2021-12-16 DIAGNOSIS — I358 Other nonrheumatic aortic valve disorders: Secondary | ICD-10-CM | POA: Diagnosis not present

## 2021-12-16 DIAGNOSIS — I10 Essential (primary) hypertension: Secondary | ICD-10-CM | POA: Diagnosis present

## 2021-12-16 DIAGNOSIS — M869 Osteomyelitis, unspecified: Secondary | ICD-10-CM | POA: Diagnosis present

## 2021-12-16 DIAGNOSIS — I700 Atherosclerosis of aorta: Secondary | ICD-10-CM | POA: Diagnosis present

## 2021-12-16 DIAGNOSIS — R1011 Right upper quadrant pain: Secondary | ICD-10-CM | POA: Diagnosis present

## 2021-12-16 DIAGNOSIS — F419 Anxiety disorder, unspecified: Secondary | ICD-10-CM | POA: Diagnosis present

## 2021-12-16 DIAGNOSIS — Z681 Body mass index (BMI) 19 or less, adult: Secondary | ICD-10-CM | POA: Diagnosis not present

## 2021-12-16 DIAGNOSIS — E785 Hyperlipidemia, unspecified: Secondary | ICD-10-CM | POA: Diagnosis present

## 2021-12-16 DIAGNOSIS — R64 Cachexia: Secondary | ICD-10-CM | POA: Diagnosis present

## 2021-12-16 DIAGNOSIS — Z9641 Presence of insulin pump (external) (internal): Secondary | ICD-10-CM | POA: Diagnosis present

## 2021-12-16 DIAGNOSIS — Z7960 Long term (current) use of unspecified immunomodulators and immunosuppressants: Secondary | ICD-10-CM | POA: Diagnosis not present

## 2021-12-16 DIAGNOSIS — I2510 Atherosclerotic heart disease of native coronary artery without angina pectoris: Secondary | ICD-10-CM | POA: Diagnosis present

## 2021-12-16 DIAGNOSIS — Z7952 Long term (current) use of systemic steroids: Secondary | ICD-10-CM | POA: Diagnosis not present

## 2021-12-16 DIAGNOSIS — I340 Nonrheumatic mitral (valve) insufficiency: Secondary | ICD-10-CM | POA: Diagnosis not present

## 2021-12-16 DIAGNOSIS — I25119 Atherosclerotic heart disease of native coronary artery with unspecified angina pectoris: Secondary | ICD-10-CM | POA: Diagnosis not present

## 2021-12-16 LAB — CBC WITH DIFFERENTIAL/PLATELET
Abs Immature Granulocytes: 0.02 10*3/uL (ref 0.00–0.07)
Basophils Absolute: 0.1 10*3/uL (ref 0.0–0.1)
Basophils Relative: 1 %
Eosinophils Absolute: 0.2 10*3/uL (ref 0.0–0.5)
Eosinophils Relative: 3 %
HCT: 35 % — ABNORMAL LOW (ref 36.0–46.0)
Hemoglobin: 11.5 g/dL — ABNORMAL LOW (ref 12.0–15.0)
Immature Granulocytes: 0 %
Lymphocytes Relative: 28 %
Lymphs Abs: 1.6 10*3/uL (ref 0.7–4.0)
MCH: 31.3 pg (ref 26.0–34.0)
MCHC: 32.9 g/dL (ref 30.0–36.0)
MCV: 95.4 fL (ref 80.0–100.0)
Monocytes Absolute: 0.8 10*3/uL (ref 0.1–1.0)
Monocytes Relative: 14 %
Neutro Abs: 3.1 10*3/uL (ref 1.7–7.7)
Neutrophils Relative %: 54 %
Platelets: 122 10*3/uL — ABNORMAL LOW (ref 150–400)
RBC: 3.67 MIL/uL — ABNORMAL LOW (ref 3.87–5.11)
RDW: 13.8 % (ref 11.5–15.5)
WBC: 5.7 10*3/uL (ref 4.0–10.5)
nRBC: 0 % (ref 0.0–0.2)

## 2021-12-16 LAB — URINALYSIS, COMPLETE (UACMP) WITH MICROSCOPIC
Bilirubin Urine: NEGATIVE
Glucose, UA: 50 mg/dL — AB
Hgb urine dipstick: NEGATIVE
Ketones, ur: NEGATIVE mg/dL
Nitrite: NEGATIVE
Protein, ur: NEGATIVE mg/dL
Specific Gravity, Urine: 1.023 (ref 1.005–1.030)
pH: 5 (ref 5.0–8.0)

## 2021-12-16 LAB — RESP PANEL BY RT-PCR (FLU A&B, COVID) ARPGX2
Influenza A by PCR: NEGATIVE
Influenza B by PCR: NEGATIVE
SARS Coronavirus 2 by RT PCR: NEGATIVE

## 2021-12-16 LAB — ECHOCARDIOGRAM COMPLETE
AR max vel: 2.97 cm2
AV Area VTI: 2.92 cm2
AV Area mean vel: 3.15 cm2
AV Mean grad: 3 mmHg
AV Peak grad: 6.3 mmHg
Ao pk vel: 1.25 m/s
Area-P 1/2: 2.48 cm2
Height: 68 in
S' Lateral: 2.2 cm
Weight: 1975.32 oz

## 2021-12-16 LAB — COMPREHENSIVE METABOLIC PANEL
ALT: 11 U/L (ref 0–44)
AST: 13 U/L — ABNORMAL LOW (ref 15–41)
Albumin: 3 g/dL — ABNORMAL LOW (ref 3.5–5.0)
Alkaline Phosphatase: 71 U/L (ref 38–126)
Anion gap: 7 (ref 5–15)
BUN: 11 mg/dL (ref 8–23)
CO2: 26 mmol/L (ref 22–32)
Calcium: 8.7 mg/dL — ABNORMAL LOW (ref 8.9–10.3)
Chloride: 107 mmol/L (ref 98–111)
Creatinine, Ser: 0.5 mg/dL (ref 0.44–1.00)
GFR, Estimated: >60 mL/min (ref >60)
Glucose, Bld: 169 mg/dL — ABNORMAL HIGH (ref 70–99)
Potassium: 4 mmol/L (ref 3.5–5.1)
Sodium: 140 mmol/L (ref 135–145)
Total Bilirubin: 0.6 mg/dL (ref 0.3–1.2)
Total Protein: 6.5 g/dL (ref 6.5–8.1)

## 2021-12-16 LAB — PROTIME-INR
INR: 1.1 (ref 0.8–1.2)
Prothrombin Time: 13.8 seconds (ref 11.4–15.2)

## 2021-12-16 LAB — GLUCOSE, CAPILLARY
Glucose-Capillary: 111 mg/dL — ABNORMAL HIGH (ref 70–99)
Glucose-Capillary: 194 mg/dL — ABNORMAL HIGH (ref 70–99)
Glucose-Capillary: 203 mg/dL — ABNORMAL HIGH (ref 70–99)
Glucose-Capillary: 220 mg/dL — ABNORMAL HIGH (ref 70–99)

## 2021-12-16 LAB — MAGNESIUM: Magnesium: 1.8 mg/dL (ref 1.7–2.4)

## 2021-12-16 LAB — RAPID URINE DRUG SCREEN, HOSP PERFORMED
Amphetamines: NOT DETECTED
Barbiturates: NOT DETECTED
Benzodiazepines: NOT DETECTED
Cocaine: NOT DETECTED
Opiates: POSITIVE — AB
Tetrahydrocannabinol: NOT DETECTED

## 2021-12-16 LAB — CBG MONITORING, ED: Glucose-Capillary: 190 mg/dL — ABNORMAL HIGH (ref 70–99)

## 2021-12-16 LAB — HEMOGLOBIN A1C
Hgb A1c MFr Bld: 7.1 % — ABNORMAL HIGH (ref 4.8–5.6)
Mean Plasma Glucose: 157.07 mg/dL

## 2021-12-16 LAB — STREP PNEUMONIAE URINARY ANTIGEN: Strep Pneumo Urinary Antigen: NEGATIVE

## 2021-12-16 LAB — LACTATE DEHYDROGENASE: LDH: 119 U/L (ref 98–192)

## 2021-12-16 LAB — SEDIMENTATION RATE: Sed Rate: 60 mm/hr — ABNORMAL HIGH (ref 0–22)

## 2021-12-16 LAB — PROCALCITONIN: Procalcitonin: <0.1 ng/mL

## 2021-12-16 LAB — C-REACTIVE PROTEIN: CRP: 0.6 mg/dL (ref <1.0)

## 2021-12-16 LAB — LACTIC ACID, PLASMA: Lactic Acid, Venous: 1.2 mmol/L (ref 0.5–1.9)

## 2021-12-16 MED ORDER — VANCOMYCIN HCL IN DEXTROSE 1-5 GM/200ML-% IV SOLN: 1000.0000 mg | INTRAVENOUS | Status: DC

## 2021-12-16 MED ORDER — ROSUVASTATIN CALCIUM 20 MG PO TABS
40.0000 mg | ORAL_TABLET | Freq: Every day | ORAL | Status: DC
  Administered 2021-12-16 – 2021-12-19 (×4): 40 mg via ORAL
  Filled 2021-12-16 (×4): qty 2

## 2021-12-16 MED ORDER — GADOBUTROL 1 MMOL/ML IV SOLN
5.0000 mL | Freq: Once | INTRAVENOUS | Status: AC | PRN
  Administered 2021-12-16: 5 mL via INTRAVENOUS

## 2021-12-16 MED ORDER — ACETAMINOPHEN 650 MG RE SUPP: 650.0000 mg | Freq: Four times a day (QID) | RECTAL | Status: DC | PRN

## 2021-12-16 MED ORDER — INSULIN ASPART 100 UNIT/ML IJ SOLN
0.0000 [IU] | Freq: Three times a day (TID) | INTRAMUSCULAR | Status: DC
  Administered 2021-12-16: 3 [IU] via SUBCUTANEOUS
  Administered 2021-12-16 – 2021-12-19 (×6): 2 [IU] via SUBCUTANEOUS
  Administered 2021-12-20: 1 [IU] via SUBCUTANEOUS

## 2021-12-16 MED ORDER — VANCOMYCIN HCL 1250 MG/250ML IV SOLN: 1250.0000 mg | INTRAVENOUS | Status: DC

## 2021-12-16 MED ORDER — HYDROMORPHONE HCL 1 MG/ML IJ SOLN
0.5000 mg | INTRAMUSCULAR | Status: DC | PRN
  Administered 2021-12-16 – 2021-12-19 (×9): 0.5 mg via INTRAVENOUS
  Filled 2021-12-16 (×9): qty 0.5

## 2021-12-16 MED ORDER — SERTRALINE HCL 50 MG PO TABS
75.0000 mg | ORAL_TABLET | Freq: Every day | ORAL | Status: DC
  Administered 2021-12-16 – 2021-12-19 (×4): 75 mg via ORAL
  Filled 2021-12-16 (×4): qty 1

## 2021-12-16 MED ORDER — ONDANSETRON HCL 4 MG/2ML IJ SOLN: 4.0000 mg | Freq: Four times a day (QID) | INTRAMUSCULAR | Status: DC | PRN

## 2021-12-16 MED ORDER — SODIUM CHLORIDE 0.9 % IV SOLN: 2.0000 g | INTRAVENOUS | Status: DC

## 2021-12-16 MED ORDER — LACTATED RINGERS IV SOLN: INTRAVENOUS | Status: DC

## 2021-12-16 MED ORDER — ACETAMINOPHEN 325 MG PO TABS
650.0000 mg | ORAL_TABLET | Freq: Four times a day (QID) | ORAL | Status: DC | PRN
  Administered 2021-12-19: 650 mg via ORAL
  Filled 2021-12-16: qty 2

## 2021-12-16 MED ORDER — INSULIN ASPART 100 UNIT/ML IJ SOLN
0.0000 [IU] | Freq: Every day | INTRAMUSCULAR | Status: DC
  Administered 2021-12-16 – 2021-12-19 (×2): 2 [IU] via SUBCUTANEOUS

## 2021-12-16 MED ORDER — INSULIN GLARGINE-YFGN 100 UNIT/ML ~~LOC~~ SOLN
5.0000 [IU] | Freq: Every day | SUBCUTANEOUS | Status: DC
  Administered 2021-12-16: 5 [IU] via SUBCUTANEOUS
  Filled 2021-12-16 (×2): qty 0.05

## 2021-12-16 MED ORDER — METOPROLOL SUCCINATE ER 25 MG PO TB24
12.5000 mg | ORAL_TABLET | Freq: Every morning | ORAL | Status: DC
  Administered 2021-12-16 – 2021-12-19 (×4): 12.5 mg via ORAL
  Filled 2021-12-16 (×4): qty 1

## 2021-12-16 MED ORDER — PREGABALIN 75 MG PO CAPS
75.0000 mg | ORAL_CAPSULE | Freq: Two times a day (BID) | ORAL | Status: DC
  Administered 2021-12-16 – 2021-12-19 (×8): 75 mg via ORAL
  Filled 2021-12-16 (×8): qty 1

## 2021-12-16 MED ORDER — LEFLUNOMIDE 20 MG PO TABS
10.0000 mg | ORAL_TABLET | Freq: Every day | ORAL | Status: DC
  Administered 2021-12-16 – 2021-12-19 (×4): 10 mg via ORAL
  Filled 2021-12-16 (×5): qty 0.5

## 2021-12-16 MED ORDER — INSULIN PUMP
Freq: Three times a day (TID) | SUBCUTANEOUS | Status: DC
  Filled 2021-12-16: qty 1

## 2021-12-16 NOTE — Progress Notes (Signed)
?  Transition of Care (TOC) Screening Note ? ? ?Patient Details  ?Name: Nichole Santiago ?Date of Birth: [birth year] ? ? ?Transition of Care (TOC) CM/SW Contact:    ?Kanen Mottola, Meriam Sprague, RN ?Phone Number: ?12/16/2021, 1:04 PM ? ? ? ?Transition of Care Department [facility]) has reviewed patient and no TOC needs have been identified at this time. We will continue to monitor patient advancement through interdisciplinary progression rounds. If new patient transition needs arise, please place a TOC consult. ?  ?

## 2021-12-16 NOTE — Assessment & Plan Note (Addendum)
No suspicious hepatic lesion ?Suspect RUQ pain related to cavitary nodule in RLL ?

## 2021-12-16 NOTE — Progress Notes (Signed)
?PROGRESS NOTE ? ? ? ?Nichole Santiago  [id number] DOB: 09/29/1950 DOA: 12/15/2021 ?PCP: Chesley Noon, MD  ?Chief Complaint  ?Patient presents with  ? Abdominal Pain  ? ? ?Brief Narrative:  ?Nichole Santiago is Nichole Santiago 71 y.o. female with medical history significant for type 1 diabetes mellitus on insulin pump, hypertension, hyperlipidemia, rheumatoid arthritis on chronic immunosuppressive therapy, who is admitted to [facility] on 12/15/2021 by way of transfer from [facility] emergency department for further evaluation management of erosive changes noted at T11/T12 after presenting from home to the latter facility for further evaluation of potential gallbladder mass identified on outpatient ultrasound.  Imaging has showed multiple pulmonary nodules of unclear significance. ? ?See below for additional details ?   ? ? ?Assessment & Plan: ?  ?Principal Problem: ?  Liver lesion, right lobe ?Active Problems: ?  Pulmonary nodules ?  Abdominal pain ?  Dehydration ?  Diabetes mellitus without complication ([place]) ?  HLD (hyperlipidemia) ?  HTN (hypertension) ?  RA (rheumatoid arthritis) ([place]) ? ? ?Assessment and Plan: ?* Liver lesion, right lobe ?Will follow with MRI when able (likely tomorrow) ? ? ?Pulmonary nodules ?CT abd/pelvis yesterday showed several pulmonary nodules scattered throughout R lung base ?CT showed numerous pulmonary nodules with basilar and subpleural predilection, many cavitary in nature.   ?CT chest was concerning for discitis/osteomyelitis at T11-12, but an MRI of the thoracic spine ruled that out (MR showed advanced degenerative disc disease with 2 large disc extrusions at T11-12 resulting in moderate spinal canal stenosis and cord deformity without abnormal cord signal.  Moderate bilateral neuroforaminal stenosis at T10-11 and T11-12). ?Inflammatory markers not that impressive, sed rate 60.  CRP wnl.  Procalcitonin is negative.  ?Blood cultures pending ?Echo is pending ?Holding antibiotics for now,  will follow ?Differential is broad for pulm nodules -> septic emboli vs mets vs due to rheumatoid vs etc ?Pulmonary has been consulted ? ? ?Dehydration ?Will continue to monitor with IVF ? ? ? ? ? ?Abdominal pain ?RUQ abdominal pain, based on CT, suspect maybe related to the posteriomedial RLL cavitary nodule ?Has 1 cm hyperdense liver lesion, follow up pending MRI  ? ?Diabetes mellitus without complication ([place]) ?Will start basal insulin with SSI ?Continue to monitor ?Typically uses insulin pump, but it wasn't on her this morning ? ? ? ? ?HLD (hyperlipidemia) ?crestor ? ? ?HTN (hypertension) ?Metoprolol ?Losartan on hold ? ? ?RA (rheumatoid arthritis) ([place]) ?Continue arava ? ? ? ?DVT prophylaxis: SCD ?Code Status: full ?Family Communication: daughters at bedside ?Disposition:  ? ?Status is: Inpatient ?Remains inpatient appropriate because: continued w/u pulmonary nodules ?  ?Consultants:  ?pulm ? ?Procedures:  ?none ? ?Antimicrobials:  ?Anti-infectives (From admission, onward)  ? ? Start     Dose/Rate Route Frequency Ordered Stop  ? 12/16/21 2200  cefTRIAXone (ROCEPHIN) 2 g in sodium chloride 0.9 % 100 mL IVPB  Status:  Discontinued       ? 2 g ?200 mL/hr over 30 Minutes Intravenous Every 24 hours 12/16/21 0510 12/16/21 0949  ? 12/16/21 2200  vancomycin (VANCOREADY) IVPB 1250 mg/250 mL  Status:  Discontinued       ? 1,250 mg ?166.7 mL/hr over 90 Minutes Intravenous Every 24 hours 12/16/21 0519 12/16/21 0817  ? 12/16/21 2200  vancomycin (VANCOCIN) IVPB 1000 mg/200 mL premix  Status:  Discontinued       ? 1,000 mg ?200 mL/hr over 60 Minutes Intravenous Every 24 hours 12/16/21 0817 12/16/21 0949  ? 12/15/21  2230  cefTRIAXone (ROCEPHIN) 2 g in sodium chloride 0.9 % 100 mL IVPB       ? 2 g ?200 mL/hr over 30 Minutes Intravenous  Once 12/15/21 2218 12/15/21 2345  ? 12/15/21 2230  vancomycin (VANCOCIN) IVPB 1000 mg/200 mL premix       ? 1,000 mg ?200 mL/hr over 60 Minutes Intravenous  Once 12/15/21 2218 12/16/21 0019  ? ?   ? ? ?Subjective: ?No new complaints ? ?Objective: ?Vitals:  ? 12/16/21 0043 12/16/21 0446 12/16/21 0910 12/16/21 1317  ?BP: (!) 147/76 (!) 118/56 (!) 144/71 114/65  ?Pulse: 99 84 78 67  ?Resp: 20 20 16 16   ?Temp: 97.6 ?F (36.4 ?C) (!) 97.4 ?F (36.3 ?C) 97.7 ?F (36.5 ?C) 98.1 ?F (36.7 ?C)  ?TempSrc: Oral Oral Oral Oral  ?SpO2: 93% 92% 90% 94%  ?Weight:  56 kg    ?Height:      ? ? ?Intake/Output Summary (Last 24 hours) at 12/16/2021 1504 ?Last data filed at 12/16/2021 1208 ?Gross per 24 hour  ?Intake 1843.54 ml  ?Output 175 ml  ?Net 1668.54 ml  ? ?Filed Weights  ? 12/15/21 1653 12/16/21 0446  ?Weight: 59 kg 56 kg  ? ? ?Examination: ? ?General exam: Appears calm and comfortable  ?Respiratory system: unlabored ?Cardiovascular system: RRR ?Gastrointestinal system: Abdomen is nondistended, soft and nontender.  ?Central nervous system: Alert and oriented. No focal neurological deficits. ?Extremities: no LEE ? ? ?Data Reviewed: I have personally reviewed following labs and imaging studies ? ?CBC: ?Recent Labs  ?Lab 12/15/21 ?1656 12/16/21 ?0235  ?WBC 5.5 5.7  ?NEUTROABS  --  3.1  ?HGB 13.0 11.5*  ?HCT 39.3 35.0*  ?MCV 92.9 95.4  ?PLT 141* 122*  ? ? ?Basic Metabolic Panel: ?Recent Labs  ?Lab 12/15/21 ?1656 12/16/21 ?0235  ?NA 139 140  ?K 3.8 4.0  ?CL 104 107  ?CO2 24 26  ?GLUCOSE 152* 169*  ?BUN 13 11  ?CREATININE 0.49 0.50  ?CALCIUM 9.6 8.7*  ?MG  --  1.8  ? ? ?GFR: ?Estimated Creatinine Clearance: 57.8 mL/min (by C-G formula based on SCr of 0.5 mg/dL). ? ?Liver Function Tests: ?Recent Labs  ?Lab 12/15/21 ?1656 12/16/21 ?0235  ?AST 13* 13*  ?ALT 9 11  ?ALKPHOS 82 71  ?BILITOT 0.8 0.6  ?PROT 7.2 6.5  ?ALBUMIN 3.8 3.0*  ? ? ?CBG: ?Recent Labs  ?Lab 12/16/21 ?[id number] 12/16/21 ?0740 12/16/21 ?1208  ?GLUCAP 190* 111* 194*  ? ? ? ?Recent Results (from the past 240 hour(s))  ?Culture, blood (routine x 2)     Status: None (Preliminary result)  ? Collection Time: 12/15/21 10:36 PM  ? Specimen: BLOOD LEFT FOREARM  ?Result Value Ref Range  Status  ? Specimen Description   Final  ?  BLOOD LEFT FOREARM ?Performed at [facility], [place] [address], [place], [place] [address] ?  ? Special Requests   Final  ?  BLOOD LEFT FOREARM ?Performed at [facility], [address], [place], [place] [address] ?  ? Culture PENDING  Incomplete  ? Report Status PENDING  Incomplete  ?Culture, blood (routine x 2)     Status: None (Preliminary result)  ? Collection Time: 12/15/21 10:36 PM  ? Specimen: BLOOD  ?Result Value Ref Range Status  ? Specimen Description   Final  ?  BLOOD Blood Culture adequate volume ?Performed at [facility], [address], [place], [place] [address] ?  ? Special Requests   Final  ?  BLOOD LEFT HAND BOTTLES DRAWN AEROBIC AND ANAEROBIC ?Performed at [facility], [place] [address], [place], [place] [address] ?  ? Culture PENDING  Incomplete  ? Report Status PENDING  Incomplete  ?Resp Panel by RT-PCR (Flu Ozie Dimaria&B, Covid) Nasopharyngeal Swab     Status: None  ? Collection Time: 12/15/21 11:20 PM  ? Specimen: Nasopharyngeal Swab; Nasopharyngeal(NP) swabs in vial transport medium  ?Result Value Ref Range Status  ? SARS Coronavirus 2 by RT PCR NEGATIVE NEGATIVE Final  ?  Comment: (NOTE) ?SARS-CoV-2 target nucleic acids are NOT DETECTED. ? ?The SARS-CoV-2 RNA is generally detectable in upper respiratory ?specimens during the acute phase of infection. The lowest ?concentration of SARS-CoV-2 viral copies this assay can detect is ?138 copies/mL. Jeanifer Halliday negative result does not preclude SARS-Cov-2 ?infection and should not be used as the sole basis for treatment or ?other patient management decisions. Haytham Maher negative result may occur with  ?improper specimen collection/handling, submission of specimen other ?than nasopharyngeal swab, presence of viral mutation(s) within the ?areas targeted by this assay, and inadequate number of viral ?copies(<138 copies/mL). Tomeko Scoville negative result must be combined with ?clinical observations,  patient history, and epidemiological ?information. The expected result is Negative. ? ?Fact Sheet for Patients:  ?EntrepreneurPulse.com.au ? ?Fact Sheet for Healthcare Providers:  ?h

## 2021-12-16 NOTE — H&P (Signed)
?History and Physical  ? ? ?PLEASE NOTE THAT DRAGON DICTATION SOFTWARE WAS USED IN THE CONSTRUCTION OF THIS NOTE. ? ? ?[name] ZOX:[id number] DOB: 1950-11-16 DOA: 12/15/2021 ? ?PCP: Nichole Noon, MD  ?Patient coming from: home  ? ?I have personally briefly reviewed patient's old medical records in [place] ? ?Chief Complaint: Gallbladder mass ? ?HPI: Nichole Santiago is a 71 y.o. female with medical history significant for type 1 diabetes mellitus on insulin pump, hypertension, hyperlipidemia, rheumatoid arthritis on chronic immunosuppressive therapy, who is admitted to [facility] on 12/15/2021 by way of transfer from [facility] emergency department for further evaluation management of erosive changes noted at T11/T12 after presenting from home to the latter facility for further evaluation of potential gallbladder mass identified on outpatient ultrasound. ? ?The patient reports that she has been experiencing intermittent abdominal discomfort over the course of the last several weeks.  She notes that this pain is most prominent in the right upper quadrant, with some radiation over the epigastrium as well as into the right flank.  She notes that this has been associated with intermittent nausea resulting in 3-4 episodes of nonbloody, nonbilious emesis over the course of the last few weeks, but also resulting in notable decline in oral intake of both food and water over that timeframe.  Denies any associated subjective fever, chills, rigors, or generalized myalgias.  No recent trauma or travel. ? ?Medical history notable for rheumatoid arthritis for which she is on chronic immunosuppressive therapy via [place].  No known history of underlying malignancy.  Long-term smoker.  Denies regular alcohol consumption. ? ?In the setting of the above abdominal discomfort, she underwent right upper quadrant ultrasound as an outpatient, which reportedly showed endings concerning for gallbladder mass.  Upon  receipt of this report, PCP reportedly encouraged the patient to present to the local emergency department for further evaluation and management of this potential laboratory abnormality. ? ? ? ? ? ?[facility] ED Course:  ?Vital signs in the ED were notable for the following: Temperature max 98.9; heart rate 66-80; blood pressure 107/94 - 139/82; respiratory rate 16-20, oxygen saturation 95 to 97% on room air. ? ?Labs were notable for the following: CMP notable for the following: Creatinine 0.49, BUN/creatinine ratio 26.5, liver enzymes within normal limits.  Lipase less than 10.  CBC notable for white cell count 5500.  Lactic acid 0.9.  Urinalysis showed no white blood cells, nitrate negative, specific IV 1.041.  COVID-19/influenza PCR negative.  Blood cultures x2 collected prior to initiation of IV antibiotics. ? ?Imaging and additional notable ED work-up: CT abdomen pelvis with contrast showed multiple pulmonary nodules in the right lung base, largest a cavitary two-point centimeter nodule with differential including pulmonary metastasis versus septic emboli; additionally, questionable 1.0 cm slightly hyperdense right liver dome lesion is noted; otherwise, no evidence of acute intra-abdominal or acute intrapelvic process. ? ?CT chest showed no evidence of acute pulmonary embolism, but did show multiple pulmonary nodules, many of which are cavitary in nature with differential including septic emboli versus necrotic pulmonary metastasis; additionally, CT chest showed evidence of erosive changes at T11-T12 with perivertebral soft tissue and subcutaneous gas suspicious for early changes of discitis/osteomyelitis, with associated recommendation to pursue MRI with contrast of the thoracic spine to further evaluate.  ? ?While in the ED, the following were administered: Dilaudid 1 mg IV x1, Zofran 4 mg IV x1, IV vancomycin, Rocephin, normal saline x1 L bolus. ? ?Subsequently, the patient was transferred  to [facility] for  further evaluation/management of aforementioned erosive changes identified T11/T12 as well as questionable 1.0 cm slightly hyperdense right liver dome lesion, presentation suspicious for dehydration.  ? ? ?Review of Systems: As per HPI otherwise 10 point review of systems negative.  ? ?Past Medical History:  ?Diagnosis Date  ? CAD (coronary artery disease)   ? Cardiac tamponade   ? Cardiac/pericardial tamponade   ? Cerebral aneurysm   ? Diabetes mellitus without complication ([place])   ? Foot ulcer ([place])   ? Insulin pump in place   ? Osteomyelitis of ankle or foot, acute, left ([place])   ? RA (rheumatoid arthritis) ([place])   ? Unstable angina ([facility])   ? ? ?History reviewed. No pertinent surgical history. ? ?Social History: ? reports that she has been smoking cigarettes. She has been smoking an average of .5 packs per day. She has never used smokeless tobacco. She reports that she does not currently use alcohol. She reports that she does not currently use drugs. ? ? ?No Known Allergies ? ?History reviewed. No pertinent family history. ? ?Family history reviewed and not pertinent  ? ? ?Prior to Admission medications   ?Medication Sig Start Date End Date Taking? Authorizing Provider  ?aspirin EC 81 MG tablet Take 81 mg by mouth daily.   Yes [provider]  ?insulin lispro (HUMALOG) 100 UNIT/ML injection Use up to 40 units a day in the insulin pump 10/23/19  Yes Philemon Kingdom, MD  ?leflunomide (ARAVA) 10 MG tablet Take 10 mg by mouth daily. 10/17/19  Yes [provider]  ?losartan (COZAAR) 25 MG tablet Take 1 tablet by mouth daily. 01/22/18  Yes [provider]  ?metoprolol succinate (TOPROL-XL) 25 MG 24 hr tablet Take 0.5 tablets by mouth every morning. 08/21/17  Yes [provider]  ?Jonetta Speak LANCETS 16P MISC by Does not apply route. 09/02/13  Yes [provider]  ?pregabalin (LYRICA) 75 MG capsule Take 75 mg by mouth 2 (two) times daily. 07/16/19  Yes [provider]  ?rosuvastatin (CRESTOR) 40 MG tablet Take 40 mg by mouth at bedtime. 09/16/19  Yes [provider]  ?sertraline (ZOLOFT) 50 MG tablet Take 75 mg by mouth daily. 10/06/19  Yes [provider]  ?ALPRAZolam Duanne Moron) 0.5 MG tablet Take 0.5 mg by mouth 3 (three) times daily as needed for anxiety.    [provider]  ?clopidogrel (PLAVIX) 75 MG tablet Take 1 tablet by mouth daily. 12/16/14   [provider]  ?Glucagon, rDNA, (GLUCAGON EMERGENCY) 1 MG KIT Inject under skin 1 mg as needed for hypoglycemia 10/23/19   Philemon Kingdom, MD  ?glucose blood test strip TEST 6 TIMES DAILY 03/07/17   [provider]  ?ondansetron (ZOFRAN-ODT) 4 MG disintegrating tablet Take 4 mg by mouth every 8 (eight) hours as needed for nausea or vomiting.    [provider]  ?predniSONE (DELTASONE) 5 MG tablet Take 5 mg by mouth daily with breakfast.    [provider]  ?Vitamin D, Ergocalciferol, (DRISDOL) 1.25 MG (50000 UNIT) CAPS capsule Take 50,000 Units by mouth once a week. 09/11/19   [provider]  ? ? ? ?Objective  ? ? ?Physical Exam: ?Vitals:  ? 12/15/21 1900 12/15/21 2100 12/15/21 2200 12/16/21 0043  ?BP: (!) 107/95 (!) 164/86 139/82 (!) 147/76  ?Pulse: 66 80 73 99  ?Resp: _0 ?Temp:    97.6 ?F (36.4 ?C)  ?TempSrc:    Oral  ?  SpO2: 95% 96% 95% 93%  ?Weight:      ?Height:      ? ? ?General: appears to be stated age; alert, oriented ?Skin: warm, dry, no rash ?Head:  AT/[place] ?Mouth:  Oral mucosa membranes appear dry, normal dentition ?Neck: supple; trachea midline ?Heart:  RRR; did not appreciate any M/R/G ?Lungs: CTAB, did not appreciate any wheezes, rales, or rhonchi ?Abdomen: + BS; soft, ND, NT ?Vascular: 2+ pedal pulses b/l; 2+ radial pulses b/l ?Extremities: no peripheral edema, no muscle wasting ?Neuro: strength and sensation intact in upper and lower extremities b/l ? ? ? ?Labs on Admission: I have personally reviewed following labs and imaging  studies ? ?CBC: ?Recent Labs  ?Lab 12/15/21 ?1656 12/16/21 ?0235  ?WBC 5.5 5.7  ?NEUTROABS  --  3.1  ?HGB 13.0 11.5*  ?HCT 39.3 35.0*  ?MCV 92.9 95.4  ?PLT 141* 122*  ? ?Basic Metabolic Panel: ?Recent Labs  ?Lab 04/06

## 2021-12-16 NOTE — Progress Notes (Signed)
Pharmacy: Re- vancomycin ? 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   ? ?- scr 0.50 ?- wbc wnl ? ?Plan: ?- Will adjust vancomycin dose to 1000 mg q24h for est AUC 473 ?- ceftriaxone 2gm q24h ? ?Dorna Leitz, PharmD, BCPS ?12/16/2021 8:16 AM ? ?

## 2021-12-16 NOTE — Assessment & Plan Note (Addendum)
crestor

## 2021-12-16 NOTE — Assessment & Plan Note (Addendum)
Continue arava  

## 2021-12-16 NOTE — Assessment & Plan Note (Addendum)
RUQ abdominal pain, based on CT, suspect maybe related to the posteriomedial RLL cavitary nodule ?Has 1 cm hyperdense liver lesion,MRI without suspicious hepatic lesion ?Follow outpatient, discharge with oxycodone.  Try apap, ibuprofen prn. ?

## 2021-12-16 NOTE — Assessment & Plan Note (Addendum)
Metoprolol ?Losartan on hold ? ?

## 2021-12-16 NOTE — Progress Notes (Signed)
Pharmacy Antibiotic Note ? ?Nichole Santiago is a 71 y.o. female admitted on 12/15/2021 with for further evaluation management of erosive changes noted at T11/T12.  medical history significant for type 1 diabetes mellitus on insulin pump, hypertension, hyperlipidemia, rheumatoid arthritis on chronic immunosuppressive therapy.  Pharmacy consulted to dose vancomycin for osteomyelitis. ? ?Plan: ?Vancomycin 1gm IV x 1 then 1250mg  q24h (AUC 535.2, used TBW and Scr 0.8) ?CTX 2gm IV q24h per MD ?Follow renal function and clinical course ? ?Height: 5\' 8"  (172.7 cm) ?Weight: 59 kg (130 lb) ?IBW/kg (Calculated) : 63.9 ? ?Temp (24hrs), Avg:98 ?F (36.7 ?C), Min:97.4 ?F (36.3 ?C), Max:98.9 ?F (37.2 ?C) ? ?Recent Labs  ?Lab 12/15/21 ?1656 12/15/21 ?2236 12/16/21 ?0235  ?WBC 5.5  --  5.7  ?CREATININE 0.49  --  0.50  ?LATICACIDVEN  --  0.9 1.2  ?  ?Estimated Creatinine Clearance: 60.9 mL/min (by C-G formula based on SCr of 0.5 mg/dL).   ? ?No Known Allergies ? ?Antimicrobials this admission: ?4/6 vanc >> ?4/6 CTX >> ? ?Dose adjustments this admission: ? ? ?Microbiology results: ?4/6 BCx: ? ?Thank you for allowing pharmacy to be a part of this patient?s care. ? ?Nichole Santiago RPh ?12/16/2021, 5:18 AM ? ?

## 2021-12-16 NOTE — Assessment & Plan Note (Addendum)
Will start basal/bolus insulin with SSI ?Continue to monitor ?Typically uses insulin pump, but it wasn't on her this morning -> "would not restart pump until pt sees endo after d/c" per 4/7 diabetes coordinator note - last saw endo 2 years ago ? ?Discussed with daughter brittany who manages her mother's insulin.  She's more comfortable continuing pump, recommended follow up and discussion with PCP and establishing with endocrine.  Continue pump at discharge ? ? ? ?

## 2021-12-16 NOTE — Progress Notes (Signed)
Inpatient Diabetes Program Recommendations ? ?AACE/ADA: New Consensus Statement on Inpatient Glycemic Control (2015) ? ?Target Ranges:  Prepandial:   less than 140 mg/dL ?     Peak postprandial:   less than 180 mg/dL (1-2 hours) ?     Critically ill patients:  140 - 180 mg/dL  ? ?Lab Results  ?Component Value Date  ? GLUCAP 194 (H) 12/16/2021  ? HGBA1C 7.1 (H) 12/16/2021  ? ? ?Review of Glycemic Control ? ?Diabetes history: DM1 ?Outpatient Diabetes medications: insulin pump ?Current orders for Inpatient glycemic control: Semglee 5 QD, Novolog 0-9 units TID with meals and 0-5 HS ? ?HgbA1C - 7.9% ?Endo: Gherghe - has not seen Dr Elvera LennoxGherghe in past 2 years.  ? ?Insulin pump basal rate 0.7 unit/H. ?Total 16.8 units per 24H ?CHO ratio - 1:12 ?CF - 30 with targer of 120 ? ?Inpatient Diabetes Program Recommendations:   ? ?Increase Semglee to 5 units BID ?Add Novolog 3 units TID with meals if eating > 50%. Titrate up if post-prandials elevated. ? ?Would not restart pump until pt sees Endo after discharge.  ? ?Continue to follow. ? ?Thank you. ?Ailene Ardshonda Marvelyn Bouchillon, RD, LDN, CDE ?Inpatient Diabetes Coordinator ?[phone]  ? ? ? ? ? ? ?

## 2021-12-16 NOTE — Assessment & Plan Note (Addendum)
improved

## 2021-12-16 NOTE — Hospital Course (Addendum)
Nichole Santiago is Nichole Santiago 71 y.o. female with medical history significant for type 1 diabetes mellitus on insulin pump, hypertension, hyperlipidemia, rheumatoid arthritis on chronic immunosuppressive therapy, who is admitted to [facility] on 12/15/2021 by way of transfer from [facility] emergency department for further evaluation management of erosive changes noted at T11/T12 after presenting from home to the latter facility for further evaluation of potential gallbladder mass identified on outpatient ultrasound.  Imaging has showed multiple pulmonary nodules of unclear significance. ? ?Workup to this point has been unrevealing.  Blood cultures are no growth x4, echo and TEE showed no evidence of vegetations.  Negative anca titers and GBM antibodies.  Pending quant gold.  She was seen by pulmonology and ID.   Pulm recommending outpatient bronchoscopy.  Currently with pending ID studies (hep C RNA quant, histoplasma antigen, blastomyces antigen).  Offered inpatient bronch by pulm, but she prefers to wait until outpatient.  Will plan to follow outpatient with pulm. ? ?See below for additional details ?  ?

## 2021-12-16 NOTE — Progress Notes (Signed)
Plan of Care Note for accepted transfer ? ? ?Patient: Nichole Santiago MRN: [id number]   DOA: 12/15/2021 ? ?Facility requesting transfer: [facility] ?Requesting Provider: Dr. Donnald Garre ?Reason for transfer: Possible metastatic disease versus disseminated infectious process with osteomyelitis of spine.  ?Facility course:  ? 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 ? ?Upon evaluation in the emergency department CT imaging of the abdomen pelvis was initially performed revealing 1 cm hyperdense right liver dome lesion in addition to multiple pulmonary nodules in the lung base.  This was followed up with CT angiogram of the chest which revealed numerous pulmonary nodules which are cavitary in nature possibly secondary to necrotic pulmonary metastases or even septic emboli.  Additionally, there is findings of possible discitis and osteomyelitis of T11-12. ? ?ER providers requesting hospitalization for further work-up of disseminated infectious versus metastatic process. ? ?Considering the patient does not have any SIRS criteria or clinical evidence of infection this is more than likely a metastatic process.  Patient would likely benefit from MRI/MRCP of the abdomen in addition to MRI of the lesion of T11 and T12 of the spine.  Results can then be discussed with interventional radiology to obtain a tissue sample. ? ? ?Plan of care: ?The patient is accepted for admission to Telemetry unit, at [facility]..  ? ? ?Author: ?Marinda Elk, MD ?12/16/2021 ? ?Check [email or web address] for on-call coverage. ? ?Nursing staff, Please call [facility] Admits & Consults System-Wide number on Amion as soon as  patient's arrival, so appropriate admitting provider can evaluate the pt. ?

## 2021-12-16 NOTE — Assessment & Plan Note (Addendum)
Differential is broad for pulm nodules -> infectious vs inflammatory vs mets, etc ?CT abd/pelvis yesterday showed several pulmonary nodules scattered throughout R lung base ?CT showed numerous pulmonary nodules with basilar and subpleural predilection, many cavitary in nature.   ?CT chest was concerning for discitis/osteomyelitis at T11-12, but an MRI of the thoracic spine ruled that out (MR showed advanced degenerative disc disease with 2 large disc extrusions at T11-12 resulting in moderate spinal canal stenosis and cord deformity without abnormal cord signal.  Moderate bilateral neuroforaminal stenosis at T10-11 and T11-12). ?Inflammatory markers not that impressive, sed rate 60.  CRP wnl.  Procalcitonin is negative.  ?Blood cultures NG x4 ?Echo with normal EF ?Holding antibiotics for now, will follow ?Awaiting TEE -> no evidence of vegetation/infective endocarditis ?ID c/s, appreciate recs -> follow TEE (no vegetation), follow urine histo ag, blastomyces ag, cryptococcal ag negative, HIV negative, hep B surface ag non reactive, hep b surface ab <3.1, hep c reactive - pending hep C RNA.  If bronch, need AFB and fungal cx  ?Pulmonary has been consulted -> GBM negative (ordered twice, 1 pending), anca (negative ordered twice, 1 pending), quant gold pending, fungitell negative, planning for outpatient bronch ? ?

## 2021-12-16 NOTE — Consult Note (Addendum)
? ?NAME:  Nichole Santiago, MRN:  [id number], DOB:  July 31, 1951, LOS: 0 ?ADMISSION DATE:  12/15/2021, CONSULTATION DATE:  12/16/21 ?REFERRING MD:  Florene Glen - [facility], CHIEF COMPLAINT:  abnormal CT chest   ? ?History of Present Illness:  ?71 yo F PMH RA on avara, tobacco use, IDDM who presented to [facility] 4/6 with abdominal pain, and was admitted to [facility] to [facility] after [place] identified possible gallbladder mass and T11/T12 changes. A CT a/p was performed and this revealed a possible 1cm R liver dome lesion, focal wall thickening of fundal gallbladder, and unexpected RLL pulmonary nodules -- including some cavitary nodules up to 2cm in size. Received vanc and cefepime on 4/6. PCT on 4/7 is < 0.1 ? ?PCCM consulted in this setting  ? ?Pertinent  Medical History  ?Tobacco abuse ?RA ?HTN ?HLD ?IDDM ?Significant Hospital Events: ?Including procedures, antibiotic start and stop dates in addition to other pertinent events   ?4/6 [facility] with RUQ abdominal pain. Abnormal RUQ [place] -- T11 T12 changes, ? Gb lesion. CT a/p revealed ?1cm liver lesion as well as numerous RLL pulm nodules  ?4/7 admit to [facility]. Pulm consult  ? ?Interim History / Subjective:  ?Consulted  ? ?Objective   ?Blood pressure 114/65, pulse 67, temperature 98.1 ?F (36.7 ?C), temperature source Oral, resp. rate 16, height _0  (1.727 m), weight 56 kg, SpO2 94 %. ?   ?   ? ?Intake/Output Summary (Last 24 hours) at 12/16/2021 1411 ?Last data filed at 12/16/2021 1208 ?Gross per 24 hour  ?Intake 1843.54 ml  ?Output 175 ml  ?Net 1668.54 ml  ? ?Filed Weights  ? 12/15/21 1653 12/16/21 0446  ?Weight: 59 kg 56 kg  ? ? ?Examination: ?General: Chronically ill older adult F NAD ?HENT: NCAT pink mm ?Lungs: CTAb even and unlabored on RA ?Cardiovascular: rrr  ?Abdomen: RUQ tenderness. + bowel sounds ?Extremities: no acute joint deformity. Chronic nodular appearing joint deformity ?Neuro: AAO 3 ?GU: defer  ? ?Resolved Hospital Problem list   ? ? ?Assessment & Plan:  ? ?Abnormal CT scan   ?Pulmonary nodules RLL, with some cavitary appearing nodules  ?-CT chest personally reviewed ?Tobacco abuse disorder ?-fairly broad ddx. Long time tobacco use-- ?malignancy vs RA related nodules vs infection / septic emboli  ?-Has not had sx c/w PNA and PCT is  < 0.1 ?P ?-probably needs bronch with BAL, will d/w pulm attending. Is very stable -- ? If this could be outpatient ?-LDH, fungitell, strep pneumo UrAg, legionella, quant gold ?-encourage tobacco cessation  ?-mobility, pulm hygiene  ?-ECHO ordered ?-follow culture data ? ?T11/T12 degenerative changes, with 2 large disc extrusions, moderate spinal canal stenosis  ?-MRI ruled out discitis  ? ?RUQ pain ?-1cm R liver dome lesion  ?P ?-PRN dilaudid  ? ?RA on arava ?P ?-cont avara ? ?IDDM ?-SSI ? ? ?Best Practice (right click and "Reselect all SmartList Selections" daily)  ?Per primary-- ?Diet/type: Regular consistency (see orders) ?DVT prophylaxis: other none ordered  ?GI prophylaxis: N/A ?Lines: N/A ?Foley:  N/A ?Code Status:  full code ?Last date of multidisciplinary goals of care discussion [ 4/7 daughters at bedside ] ? ?Labs   ?CBC: ?Recent Labs  ?Lab 12/15/21 ?1656 12/16/21 ?0235  ?WBC 5.5 5.7  ?NEUTROABS  --  3.1  ?HGB 13.0 11.5*  ?HCT 39.3 35.0*  ?MCV 92.9 95.4  ?PLT 141* 122*  ? ? ?Basic Metabolic Panel: ?Recent Labs  ?Lab 12/15/21 ?1656 12/16/21 ?0235  ?NA 139 140  ?K  3.8 4.0  ?CL 104 107  ?CO2 24 26  ?GLUCOSE 152* 169*  ?BUN 13 11  ?CREATININE 0.49 0.50  ?CALCIUM 9.6 8.7*  ?MG  --  1.8  ? ?GFR: ?Estimated Creatinine Clearance: 57.8 mL/min (by C-G formula based on SCr of 0.5 mg/dL). ?Recent Labs  ?Lab 12/15/21 ?1656 12/15/21 ?2236 12/16/21 ?0235 12/16/21 ?1308  ?PROCALCITON  --   --   --  <0.10  ?WBC 5.5  --  5.7  --   ?LATICACIDVEN  --  0.9 1.2  --   ? ? ?Liver Function Tests: ?Recent Labs  ?Lab 12/15/21 ?1656 12/16/21 ?0235  ?AST 13* 13*  ?ALT 9 11  ?ALKPHOS 82 71  ?BILITOT 0.8 0.6  ?PROT 7.2 6.5  ?ALBUMIN 3.8 3.0*  ? ?Recent Labs  ?Lab 12/15/21 ?1656   ?LIPASE <10*  ? ?No results for input(s): AMMONIA in the last 168 hours. ? ?ABG ?   ?Component Value Date/Time  ? HCO3 19.4 (L) 10/18/2019 1852  ? ACIDBASEDEF 4.8 (H) 10/18/2019 1852  ? O2SAT 52.3 10/18/2019 1852  ?  ? ?Coagulation Profile: ?Recent Labs  ?Lab 12/16/21 ?4166  ?INR 1.1  ? ? ?Cardiac Enzymes: ?No results for input(s): CKTOTAL, CKMB, CKMBINDEX, TROPONINI in the last 168 hours. ? ?HbA1C: ?Hgb A1c MFr Bld  ?Date/Time Value Ref Range Status  ?12/16/2021 07:10 AM 7.1 (H) 4.8 - 5.6 % Final  ?  Comment:  ?  (NOTE) ?Pre diabetes:          5.7%-6.4% ? ?Diabetes:              >6.4% ? ?Glycemic control for   <7.0% ?adults with diabetes ?  ? ? ?CBG: ?Recent Labs  ?Lab 12/16/21 ?0630 12/16/21 ?0740 12/16/21 ?1208  ?GLUCAP 190* 111* 194*  ? ? ?Review of Systems:   ?Review of Systems  ?Constitutional:  Positive for malaise/fatigue and weight loss.  ?HENT: Negative.    ?Eyes: Negative.   ?Respiratory:  Positive for cough, sputum production and shortness of breath.   ?Cardiovascular: Negative.   ?Gastrointestinal:  Positive for abdominal pain, nausea and vomiting.  ?Genitourinary: Negative.   ?Musculoskeletal: Negative.   ?Skin: Negative.   ?Neurological: Negative.   ?Endo/Heme/Allergies: Negative.   ?Psychiatric/Behavioral:  Positive for depression.   ? ? ?Past Medical History:  ?She,  has a past medical history of CAD (coronary artery disease), Cardiac tamponade, Cardiac/pericardial tamponade, Cerebral aneurysm, Diabetes mellitus without complication ([place]), Foot ulcer ([place]), Insulin pump in place, Osteomyelitis of ankle or foot, acute, left ([place]), RA (rheumatoid arthritis) ([place]), and Unstable angina ([place]).  ? ?Surgical History:  ?History reviewed. No pertinent surgical history.  ? ?Social History:  ? reports that she has been smoking cigarettes. She has been smoking an average of .5 packs per day. She has never used smokeless tobacco. She reports that she does not currently use alcohol. She reports that she does not  currently use drugs.  ? ?Family History:  ?Her family history is not on file.  ? ?Allergies ?No Known Allergies  ? ?Home Medications  ?Prior to Admission medications   ?Medication Sig Start Date End Date Taking? Authorizing Provider  ?metoprolol succinate (TOPROL-XL) 25 MG 24 hr tablet Take 25 mg by mouth daily. 02/06/19  Yes [provider]  ?ALPRAZolam Duanne Moron) 0.5 MG tablet Take 0.5 mg by mouth 3 (three) times daily as needed for anxiety.    [provider]  ?aspirin EC 81 MG tablet Take 81 mg by mouth daily.  [provider]  ?clopidogrel (PLAVIX) 75 MG tablet Take 1 tablet by mouth daily. 12/16/14   [provider]  ?Glucagon, rDNA, (GLUCAGON EMERGENCY) 1 MG KIT Inject under skin 1 mg as needed for hypoglycemia 10/23/19   Philemon Kingdom, MD  ?glucose blood test strip TEST 6 TIMES DAILY 03/07/17   [provider]  ?HUMALOG 100 UNIT/ML injection Inject into the skin. 12/08/21   [provider]  ?insulin lispro (HUMALOG) 100 UNIT/ML injection Use up to 40 units a day in the insulin pump 10/23/19   Philemon Kingdom, MD  ?leflunomide (ARAVA) 10 MG tablet Take 10 mg by mouth daily. 10/17/19   [provider]  ?leflunomide (ARAVA) 20 MG tablet Take 20 mg by mouth daily. 11/09/21   [provider]  ?losartan (COZAAR) 25 MG tablet Take 1 tablet by mouth daily. 01/22/18   [provider]  ?metoprolol succinate (TOPROL-XL) 25 MG 24 hr tablet Take 0.5 tablets by mouth every morning. 08/21/17   [provider]  ?ondansetron (ZOFRAN-ODT) 4 MG disintegrating tablet Take 4 mg by mouth every 8 (eight) hours as needed for nausea or vomiting.    [provider]  ?Jonetta Speak LANCETS 10O MISC by Does not apply route. 09/02/13   [provider]  ?pantoprazole (PROTONIX) 40 MG tablet Take 40 mg by mouth daily. 09/26/21   [provider]  ?predniSONE (DELTASONE) 5 MG tablet Take 5 mg by mouth daily with breakfast.     [provider]  ?pregabalin (LYRICA) 75 MG capsule Take 75 mg by mouth 2 (two) times daily. 07/16/19   [provider]  ?promethazine (PHENERGAN) 25 MG tablet Take 25 mg by mouth every 6

## 2021-12-17 DIAGNOSIS — K769 Liver disease, unspecified: Secondary | ICD-10-CM | POA: Diagnosis not present

## 2021-12-17 LAB — CBC WITH DIFFERENTIAL/PLATELET
Abs Immature Granulocytes: 0.03 10*3/uL (ref 0.00–0.07)
Basophils Absolute: 0.1 10*3/uL (ref 0.0–0.1)
Basophils Relative: 1 %
Eosinophils Absolute: 0.2 10*3/uL (ref 0.0–0.5)
Eosinophils Relative: 4 %
HCT: 31.6 % — ABNORMAL LOW (ref 36.0–46.0)
Hemoglobin: 10.2 g/dL — ABNORMAL LOW (ref 12.0–15.0)
Immature Granulocytes: 1 %
Lymphocytes Relative: 24 %
Lymphs Abs: 1.3 10*3/uL (ref 0.7–4.0)
MCH: 31.1 pg (ref 26.0–34.0)
MCHC: 32.3 g/dL (ref 30.0–36.0)
MCV: 96.3 fL (ref 80.0–100.0)
Monocytes Absolute: 0.7 10*3/uL (ref 0.1–1.0)
Monocytes Relative: 14 %
Neutro Abs: 3.1 10*3/uL (ref 1.7–7.7)
Neutrophils Relative %: 56 %
Platelets: 117 10*3/uL — ABNORMAL LOW (ref 150–400)
RBC: 3.28 MIL/uL — ABNORMAL LOW (ref 3.87–5.11)
RDW: 14 % (ref 11.5–15.5)
WBC: 5.4 10*3/uL (ref 4.0–10.5)
nRBC: 0 % (ref 0.0–0.2)

## 2021-12-17 LAB — COMPREHENSIVE METABOLIC PANEL
ALT: 10 U/L (ref 0–44)
AST: 14 U/L — ABNORMAL LOW (ref 15–41)
Albumin: 2.7 g/dL — ABNORMAL LOW (ref 3.5–5.0)
Alkaline Phosphatase: 65 U/L (ref 38–126)
Anion gap: 6 (ref 5–15)
BUN: 11 mg/dL (ref 8–23)
CO2: 27 mmol/L (ref 22–32)
Calcium: 8.7 mg/dL — ABNORMAL LOW (ref 8.9–10.3)
Chloride: 106 mmol/L (ref 98–111)
Creatinine, Ser: 0.6 mg/dL (ref 0.44–1.00)
GFR, Estimated: >60 mL/min (ref >60)
Glucose, Bld: 163 mg/dL — ABNORMAL HIGH (ref 70–99)
Potassium: 3.9 mmol/L (ref 3.5–5.1)
Sodium: 139 mmol/L (ref 135–145)
Total Bilirubin: 0.3 mg/dL (ref 0.3–1.2)
Total Protein: 5.8 g/dL — ABNORMAL LOW (ref 6.5–8.1)

## 2021-12-17 LAB — GLUCOSE, CAPILLARY
Glucose-Capillary: 166 mg/dL — ABNORMAL HIGH (ref 70–99)
Glucose-Capillary: 164 mg/dL — ABNORMAL HIGH (ref 70–99)
Glucose-Capillary: 95 mg/dL (ref 70–99)
Glucose-Capillary: 144 mg/dL — ABNORMAL HIGH (ref 70–99)

## 2021-12-17 LAB — PHOSPHORUS: Phosphorus: 3.1 mg/dL (ref 2.5–4.6)

## 2021-12-17 LAB — PROCALCITONIN: Procalcitonin: <0.1 ng/mL

## 2021-12-17 LAB — SEDIMENTATION RATE: Sed Rate: 65 mm/hr — ABNORMAL HIGH (ref 0–22)

## 2021-12-17 LAB — GLOMERULAR BASEMENT MEMBRANE ANTIBODIES: GBM Ab: <0.2 units (ref 0.0–0.9)

## 2021-12-17 LAB — MAGNESIUM: Magnesium: 1.9 mg/dL (ref 1.7–2.4)

## 2021-12-17 LAB — C-REACTIVE PROTEIN: CRP: 0.9 mg/dL (ref <1.0)

## 2021-12-17 MED ORDER — INSULIN ASPART 100 UNIT/ML IJ SOLN
3.0000 [IU] | Freq: Three times a day (TID) | INTRAMUSCULAR | Status: DC
  Administered 2021-12-17 – 2021-12-20 (×8): 3 [IU] via SUBCUTANEOUS

## 2021-12-17 MED ORDER — INSULIN GLARGINE-YFGN 100 UNIT/ML ~~LOC~~ SOLN
5.0000 [IU] | Freq: Two times a day (BID) | SUBCUTANEOUS | Status: DC
  Administered 2021-12-17 – 2021-12-20 (×7): 5 [IU] via SUBCUTANEOUS
  Filled 2021-12-17 (×9): qty 0.05

## 2021-12-17 NOTE — Progress Notes (Signed)
?PROGRESS NOTE ? ? ? ?Nichole Santiago  IWP:[id number] DOB: [birth year] DOA: 12/15/2021 ?PCP: Chesley Noon, MD  ?Chief Complaint  ?Patient presents with  ? Abdominal Pain  ? ? ?Brief Narrative:  ?Nichole Santiago is Nichole Santiago 71 y.o. female with medical history significant for type 1 diabetes mellitus on insulin pump, hypertension, hyperlipidemia, rheumatoid arthritis on chronic immunosuppressive therapy, who is admitted to [facility] on 12/15/2021 by way of transfer from [facility] emergency department for further evaluation management of erosive changes noted at T11/T12 after presenting from home to the latter facility for further evaluation of potential gallbladder mass identified on outpatient ultrasound.  Imaging has showed multiple pulmonary nodules of unclear significance. ? ?See below for additional details ?   ? ? ?Assessment & Plan: ?  ?Principal Problem: ?  Liver lesion, right lobe ?Active Problems: ?  Multiple pulmonary nodules ?  Abdominal pain ?  Dehydration ?  Diabetes mellitus without complication ([place]) ?  HLD (hyperlipidemia) ?  HTN (hypertension) ?  RA (rheumatoid arthritis) ([place]) ? ? ?Assessment and Plan: ?* Liver lesion, right lobe ?Will follow with MRI today ? ? ?Multiple pulmonary nodules ?CT abd/pelvis yesterday showed several pulmonary nodules scattered throughout R lung base ?CT showed numerous pulmonary nodules with basilar and subpleural predilection, many cavitary in nature.   ?CT chest was concerning for discitis/osteomyelitis at T11-12, but an MRI of the thoracic spine ruled that out (MR showed advanced degenerative disc disease with 2 large disc extrusions at T11-12 resulting in moderate spinal canal stenosis and cord deformity without abnormal cord signal.  Moderate bilateral neuroforaminal stenosis at T10-11 and T11-12). ?Inflammatory markers not that impressive, sed rate 60.  CRP wnl.  Procalcitonin is negative.  ?Blood cultures pending ?Echo with normal EF ?Holding antibiotics for now,  will follow ?Differential is broad for pulm nodules -> septic emboli vs mets vs due to rheumatoid vs etc ?Pulmonary has been consulted -> GBM, anca, quant gold, fungitell pending, considering bronch ? ? ?Dehydration ?Will continue to monitor with IVF ? ? ? ? ? ?Abdominal pain ?RUQ abdominal pain, based on CT, suspect maybe related to the posteriomedial RLL cavitary nodule ?Has 1 cm hyperdense liver lesion, follow up pending MRI  ? ?Diabetes mellitus without complication ([place]) ?Will start basal/bolus insulin with SSI ?Continue to monitor ?Typically uses insulin pump, but it wasn't on her this morning -> "would not restart pump until pt sees endo after d/c" per 4/7 diabetes coordinator note - last saw endo 2 years ago ? ? ? ? ?HLD (hyperlipidemia) ?crestor ? ? ?HTN (hypertension) ?Metoprolol ?Losartan on hold ? ? ?RA (rheumatoid arthritis) ([place]) ?Continue arava ? ? ? ?DVT prophylaxis: SCD ?Code Status: full ?Family Communication: daughters at bedside ?Disposition:  ? ?Status is: Inpatient ?Remains inpatient appropriate because: continued w/u pulmonary nodules ?  ?Consultants:  ?pulm ? ?Procedures:  ?none ? ?Antimicrobials:  ?Anti-infectives (From admission, onward)  ? ? Start     Dose/Rate Route Frequency Ordered Stop  ? 12/16/21 2200  cefTRIAXone (ROCEPHIN) 2 g in sodium chloride 0.9 % 100 mL IVPB  Status:  Discontinued       ? 2 g ?200 mL/hr over 30 Minutes Intravenous Every 24 hours 12/16/21 0510 12/16/21 0949  ? 12/16/21 2200  vancomycin (VANCOREADY) IVPB 1250 mg/250 mL  Status:  Discontinued       ? 1,250 mg ?166.7 mL/hr over 90 Minutes Intravenous Every 24 hours 12/16/21 0519 12/16/21 0817  ? 12/16/21 2200  vancomycin (VANCOCIN) IVPB 1000  mg/200 mL premix  Status:  Discontinued       ? 1,000 mg ?200 mL/hr over 60 Minutes Intravenous Every 24 hours 12/16/21 0817 12/16/21 0949  ? 12/15/21 2230  cefTRIAXone (ROCEPHIN) 2 g in sodium chloride 0.9 % 100 mL IVPB       ? 2 g ?200 mL/hr over 30 Minutes Intravenous  Once  12/15/21 2218 12/15/21 2345  ? 12/15/21 2230  vancomycin (VANCOCIN) IVPB 1000 mg/200 mL premix       ? 1,000 mg ?200 mL/hr over 60 Minutes Intravenous  Once 12/15/21 2218 12/16/21 0019  ? ?  ? ? ?Subjective: ?No new complaints ? ?Objective: ?Vitals:  ? 12/16/21 2024 12/17/21 0428 12/17/21 0429 12/17/21 1521  ?BP: 121/63  129/69 138/69  ?Pulse: 64  72 68  ?Resp: 18  14 17   ?Temp: 98.9 ?F (37.2 ?C)  98.2 ?F (36.8 ?C) 98.2 ?F (36.8 ?C)  ?TempSrc: Oral  Oral Oral  ?SpO2: 90%  (!) 88% (!) 88%  ?Weight:  60.7 kg    ?Height:      ? ? ?Intake/Output Summary (Last 24 hours) at 12/17/2021 1529 ?Last data filed at 12/17/2021 5176 ?Gross per 24 hour  ?Intake 2083.59 ml  ?Output 300 ml  ?Net 1783.59 ml  ? ?Filed Weights  ? 12/15/21 1653 12/16/21 0446 12/17/21 0428  ?Weight: 59 kg 56 kg 60.7 kg  ? ? ?Examination: ? ?General: No acute distress. ?Cardiovascular: RRR ?Lungs: unlabored ?Abdomen: Soft, nontender, nondistended  ?Neurological: Alert and oriented ?3. Moves all extremities ?4. Cranial nerves II through XII grossly intact. ?Skin: Warm and dry. No rashes or lesions. ?Extremities: No clubbing or cyanosis. No edema. ? ?Data Reviewed: I have personally reviewed following labs and imaging studies ? ?CBC: ?Recent Labs  ?Lab 12/15/21 ?1656 12/16/21 ?0235 12/17/21 ?1607  ?WBC 5.5 5.7 5.4  ?NEUTROABS  --  3.1 3.1  ?HGB 13.0 11.5* 10.2*  ?HCT 39.3 35.0* 31.6*  ?MCV 92.9 95.4 96.3  ?PLT 141* 122* 117*  ? ? ?Basic Metabolic Panel: ?Recent Labs  ?Lab 12/15/21 ?1656 12/16/21 ?0235 12/17/21 ?3710  ?NA 139 140 139  ?K 3.8 4.0 3.9  ?CL 104 107 106  ?CO2 24 26 27   ?GLUCOSE 152* 169* 163*  ?BUN 13 11 11   ?CREATININE 0.49 0.50 0.60  ?CALCIUM 9.6 8.7* 8.7*  ?MG  --  1.8 1.9  ?PHOS  --   --  3.1  ? ? ?GFR: ?Estimated Creatinine Clearance: 62.7 mL/min (by C-G formula based on SCr of 0.6 mg/dL). ? ?Liver Function Tests: ?Recent Labs  ?Lab 12/15/21 ?1656 12/16/21 ?0235 12/17/21 ?6269  ?AST 13* 13* 14*  ?ALT 9 11 10   ?ALKPHOS 82 71 65  ?BILITOT 0.8 0.6  0.3  ?PROT 7.2 6.5 5.8*  ?ALBUMIN 3.8 3.0* 2.7*  ? ? ?CBG: ?Recent Labs  ?Lab 12/16/21 ?1208 12/16/21 ?1700 12/16/21 ?2206 12/17/21 ?0749 12/17/21 ?1231  ?GLUCAP 194* 203* 220* 166* 164*  ? ? ? ?Recent Results (from the past 240 hour(s))  ?Culture, blood (routine x 2)     Status: None (Preliminary result)  ? Collection Time: 12/15/21 10:36 PM  ? Specimen: BLOOD LEFT FOREARM  ?Result Value Ref Range Status  ? Specimen Description   Final  ?  BLOOD LEFT FOREARM ?Performed at [facility], [place] [address] [place], [place] [address] ?  ? Special Requests   Final  ?  BLOOD LEFT FOREARM ?Performed at [facility], [address], [place], [place] [address] ?  ?  Culture   Final  ?  NO GROWTH 1 DAY ?Performed at [facility], [place] [address] [place], [place] [address] ?  ? Report Status PENDING  Incomplete  ?Culture, blood (routine x 2)     Status: None (Preliminary result)  ? Collection Time: 12/15/21 10:36 PM  ? Specimen: BLOOD  ?Result Value Ref Range Status  ? Specimen Description   Final  ?  BLOOD Blood Culture adequate volume ?Performed at [facility], [address] [place], [place] [address] ?  ? Special Requests   Final  ?  BLOOD LEFT HAND BOTTLES DRAWN AEROBIC AND ANAEROBIC  ? Culture   Final  ?  NO GROWTH 1 DAY ?Performed at [facility], [place] [address], [place], [place] [address] ?  ? Report Status PENDING  Incomplete  ?Resp Panel by RT-PCR (Flu Ovie Cornelio&B, Covid) Nasopharyngeal Swab     Status: None  ? Collection Time: 12/15/21 11:20 PM  ? Specimen: Nasopharyngeal Swab; Nasopharyngeal(NP) swabs in vial transport medium  ?Result Value Ref Range Status  ? SARS Coronavirus 2 by RT PCR NEGATIVE NEGATIVE Final  ?  Comment: (NOTE) ?SARS-CoV-2 target nucleic acids are NOT DETECTED. ? ?The SARS-CoV-2 RNA is generally detectable in upper respiratory ?specimens during the acute phase of infection. The lowest ?concentration of SARS-CoV-2 viral copies this assay can  detect is ?138 copies/mL. Antoine Vandermeulen negative result does not preclude SARS-Cov-2 ?infection and should not be used as the sole basis for treatment or ?other patient management decisions. Zymeir Salminen negative result may o

## 2021-12-18 ENCOUNTER — Inpatient Hospital Stay (HOSPITAL_COMMUNITY): Payer: Medicare Other

## 2021-12-18 ENCOUNTER — Telehealth: Payer: Self-pay | Admitting: Internal Medicine

## 2021-12-18 DIAGNOSIS — R918 Other nonspecific abnormal finding of lung field: Secondary | ICD-10-CM | POA: Diagnosis not present

## 2021-12-18 DIAGNOSIS — K769 Liver disease, unspecified: Secondary | ICD-10-CM | POA: Diagnosis not present

## 2021-12-18 LAB — CBC WITH DIFFERENTIAL/PLATELET
Abs Immature Granulocytes: 0.02 10*3/uL (ref 0.00–0.07)
Basophils Absolute: 0 10*3/uL (ref 0.0–0.1)
Basophils Relative: 1 %
Eosinophils Absolute: 0.2 10*3/uL (ref 0.0–0.5)
Eosinophils Relative: 4 %
HCT: 32.2 % — ABNORMAL LOW (ref 36.0–46.0)
Hemoglobin: 10.6 g/dL — ABNORMAL LOW (ref 12.0–15.0)
Immature Granulocytes: 0 %
Lymphocytes Relative: 24 %
Lymphs Abs: 1.3 10*3/uL (ref 0.7–4.0)
MCH: 31.5 pg (ref 26.0–34.0)
MCHC: 32.9 g/dL (ref 30.0–36.0)
MCV: 95.5 fL (ref 80.0–100.0)
Monocytes Absolute: 0.8 10*3/uL (ref 0.1–1.0)
Monocytes Relative: 14 %
Neutro Abs: 3.3 10*3/uL (ref 1.7–7.7)
Neutrophils Relative %: 57 %
Platelets: 107 10*3/uL — ABNORMAL LOW (ref 150–400)
RBC: 3.37 MIL/uL — ABNORMAL LOW (ref 3.87–5.11)
RDW: 13.9 % (ref 11.5–15.5)
WBC: 5.7 10*3/uL (ref 4.0–10.5)
nRBC: 0 % (ref 0.0–0.2)

## 2021-12-18 LAB — COMPREHENSIVE METABOLIC PANEL
ALT: 10 U/L (ref 0–44)
AST: 14 U/L — ABNORMAL LOW (ref 15–41)
Albumin: 2.7 g/dL — ABNORMAL LOW (ref 3.5–5.0)
Alkaline Phosphatase: 62 U/L (ref 38–126)
Anion gap: 6 (ref 5–15)
BUN: 10 mg/dL (ref 8–23)
CO2: 29 mmol/L (ref 22–32)
Calcium: 8.5 mg/dL — ABNORMAL LOW (ref 8.9–10.3)
Chloride: 105 mmol/L (ref 98–111)
Creatinine, Ser: 0.47 mg/dL (ref 0.44–1.00)
GFR, Estimated: >60 mL/min (ref >60)
Glucose, Bld: 143 mg/dL — ABNORMAL HIGH (ref 70–99)
Potassium: 3.7 mmol/L (ref 3.5–5.1)
Sodium: 140 mmol/L (ref 135–145)
Total Bilirubin: 0.6 mg/dL (ref 0.3–1.2)
Total Protein: 5.8 g/dL — ABNORMAL LOW (ref 6.5–8.1)

## 2021-12-18 LAB — GLUCOSE, CAPILLARY
Glucose-Capillary: 165 mg/dL — ABNORMAL HIGH (ref 70–99)
Glucose-Capillary: 156 mg/dL — ABNORMAL HIGH (ref 70–99)
Glucose-Capillary: 113 mg/dL — ABNORMAL HIGH (ref 70–99)
Glucose-Capillary: 119 mg/dL — ABNORMAL HIGH (ref 70–99)

## 2021-12-18 LAB — PROCALCITONIN: Procalcitonin: <0.1 ng/mL

## 2021-12-18 LAB — PHOSPHORUS: Phosphorus: 2.5 mg/dL (ref 2.5–4.6)

## 2021-12-18 LAB — MAGNESIUM: Magnesium: 1.9 mg/dL (ref 1.7–2.4)

## 2021-12-18 MED ORDER — TIOTROPIUM BROMIDE MONOHYDRATE 18 MCG IN CAPS: 18.0000 ug | ORAL_CAPSULE | Freq: Every day | RESPIRATORY_TRACT | Status: DC

## 2021-12-18 MED ORDER — UMECLIDINIUM BROMIDE 62.5 MCG/ACT IN AEPB
1.0000 | INHALATION_SPRAY | Freq: Every day | RESPIRATORY_TRACT | Status: DC
  Administered 2021-12-19 – 2021-12-20 (×2): 1 via RESPIRATORY_TRACT
  Filled 2021-12-18: qty 7

## 2021-12-18 MED ORDER — OXYCODONE HCL 5 MG PO TABS
5.0000 mg | ORAL_TABLET | ORAL | Status: DC | PRN
  Administered 2021-12-18 – 2021-12-19 (×2): 5 mg via ORAL
  Filled 2021-12-18 (×2): qty 1

## 2021-12-18 MED ORDER — GADOBUTROL 1 MMOL/ML IV SOLN
6.0000 mL | Freq: Once | INTRAVENOUS | Status: AC | PRN
  Administered 2021-12-18: 6 mL via INTRAVENOUS

## 2021-12-18 NOTE — Consult Note (Addendum)
? ?NAME:  Nichole Santiago, MRN:  [id number], DOB:  02-Nov-1950, LOS: 2 ?ADMISSION DATE:  12/15/2021, CONSULTATION DATE:  12/16/21 ?REFERRING MD:  Florene Glen - [facility], CHIEF COMPLAINT:  abnormal CT chest   ? ?History of Present Illness:  ?71 yo F PMH RA on avara, tobacco use, IDDM who presented to [facility] 4/6 with abdominal pain, and was admitted to [facility] to [facility] after [place] identified possible gallbladder mass and T11/T12 changes. A CT a/p was performed and this revealed a possible 1cm R liver dome lesion, focal wall thickening of fundal gallbladder, and unexpected RLL pulmonary nodules -- including some cavitary nodules up to 2cm in size. Received vanc and cefepime on 4/6. PCT on 4/7 is < 0.1 ? ?PCCM consulted in this setting  - Ungerer is  - long time smoker, has copd. HAs RA with strong hx. Consult for cavitary nodules and multiple nodules ? ?CT chest 4/6 ?IMPRESSION: ?Numerous pulmonary nodules demonstrating a basilar and subpleural ?predilection, many of which are cavitary in nature. 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 ?  ?Subtle erosive changes at T11-12 with paravertebral soft tissue and ?subcutaneous gas suspicious for early changes of ?discitis/osteomyelitis. Multiple ill-defined epidural lesions are ?also identified which are not well characterized on this ?examination. Contrast enhanced MRI examination is recommended for ?further characterization. ?  ?Mild emphysema. ?  ?Mild coronary artery calcification. ?  ?No pulmonary embolism ? ? ?Pertinent  Medical History  ?Tobacco abuse ?RA ?HTN ?HLD ?IDDM ?Significant Hospital Events: ?Including procedures, antibiotic start and stop dates in addition to other pertinent events   ?4/6 [facility] with RUQ abdominal pain. Abnormal RUQ [place] -- T11 T12 changes, ? Gb lesion. CT a/p revealed ?1cm liver lesion as well as numerous RLL pulm nodules  ?4/7  admit to [facility]. Pulm consult  ? ?Interim History / Subjective:  ? ? ?12/18/21 - PCT nromal, ESR 65. UDS positive  - opiopd. Pending -> Fungitell, ANCA GBM . AFebrile on RA.  Wants to go home but has RUQ pain that Triad MD reported is keeping her here No resp distress. Not on o2 ? ?Objective   ?Blood pressure 137/60, pulse 62, temperature 98.1 ?F (36.7 ?C), temperature source Oral, resp. rate 16, height 5' 8"  (1.727 m), weight 59.8 kg, SpO2 91 %. ?   ?   ? ?Intake/Output Summary (Last 24 hours) at 12/18/2021 1159 ?Last data filed at 12/17/2021 2047 ?Gross per 24 hour  ?Intake 300 ml  ?Output --  ?Net 300 ml  ? ? ?Filed Weights  ? 12/16/21 0446 12/17/21 0428 12/18/21 0431  ?Weight: 56 kg 60.7 kg 59.8 kg  ? ? ?Examination: ?General: Chronically ill older adult F NAD ?HENT: NCAT pink mm ?Lungs: CTAb even and unlabored on RA ?Cardiovascular: rrr  ?Abdomen: RUQ tenderness. + bowel sounds ?Extremities: no acute joint deformity. Chronic nodular appearing joint deformity ?Neuro: AAO 3 ?GU: defer  ? ?Overall no change ? ?Resolved Hospital Problem list   ? ? ?Assessment & Plan:  ? ?Abnormal CT scan  ?Pulmonary nodules RLL, with some cavitary appearing nodules  ?Tobacco abuse disorder ?-fairly broad ddx. Long time tobacco use-- ?malignancy vs RA related nodules vs infection / septic emboli  ?-Has not had sx c/w PNA and PCT is  < 0.1 ? ?4/9 DDx is cancer, RA and opprotunitis intection ? ?Plan ? - await serology vasculitis panel ?- needs OPDP PET scan ?-  then followup with Byrum/ICard =- message sent to office ? ? ?Smoking ? ?Plan ? - asdvised to quit ? ?Emphysema ? ?Plan ? -start spiriva scheduled ?- neds alb prn ? ? ?T11/T12 degenerative changes, with 2 large disc extrusions, moderate spinal canal stenosis  ?-MRI ruled out discitis  ? ?RUQ pain ?-1cm R liver dome lesion  ? ? ? Due to T11 lesion ? ?P ?Per triad ? ?RA on arava ?P ?-cont avara for now ? ?IDDM ?-SSI ? ?Followup ? - message sent to [facility] ? ? ?Ccm will sign  off ? ? ?SIGNATURE  ? ? ?Dr. Brand Males, M.D., F.C.C.P,  ?Pulmonary and Critical Care Medicine ?Staff Physician, [place] ?Center Director - Interstitial Lung Disease  Program  ?Medical Director - [place] [facility] ?Pulmonary [place] [facility] ?[place], [place], [address] ? ?NPI Number:  NPI #[id number] ?DEA Number: [id number] ? ?Pager: [phone], If no answer  -> Check AMION or Try [phone] ?Telephone (clinical office): [phone] ?Telephone (research): [phone] ? ?1:16 PM ?12/18/2021 ? ? ?LABS  ? ? ?PULMONARY ?No results for input(s): PHART, PCO2ART, PO2ART, HCO3, TCO2, O2SAT in the last 168 hours. ? ?Invalid input(s): PCO2, PO2 ? ?CBC ?Recent Labs  ?Lab 12/16/21 ?0235 12/17/21 ?0647 12/18/21 ?4665  ?HGB 11.5* 10.2* 10.6*  ?HCT 35.0* 31.6* 32.2*  ?WBC 5.7 5.4 5.7  ?PLT 122* 117* 107*  ? ? ?COAGULATION ?Recent Labs  ?Lab 12/16/21 ?9935  ?INR 1.1  ? ? ?CARDIAC ? No results for input(s): TROPONINI in the last 168 hours. ?No results for input(s): PROBNP in the last 168 hours. ? ? ?CHEMISTRY ?Recent Labs  ?Lab 12/15/21 ?1656 12/16/21 ?0235 12/17/21 ?7017 12/18/21 ?7939  ?NA 139 140 139 140  ?K 3.8 4.0 3.9 3.7  ?CL 104 107 106 105  ?CO2 24 26 27 29   ?GLUCOSE 152* 169* 163* 143*  ?BUN 13 11 11 10   ?CREATININE 0.49 0.50 0.60 0.47  ?CALCIUM 9.6 8.7* 8.7* 8.5*  ?MG  --  1.8 1.9 1.9  ?PHOS  --   --  3.1 2.5  ? ?Estimated Creatinine Clearance: 61.8 mL/min (by C-G formula based on SCr of 0.47 mg/dL). ? ? ?LIVER ?Recent Labs  ?Lab 12/15/21 ?1656 12/16/21 ?0235 12/16/21 ?0300 12/17/21 ?9233 12/18/21 ?0076  ?AST 13* 13*  --  14* 14*  ?ALT 9 11  --  10 10  ?ALKPHOS 82 71  --  65 62  ?BILITOT 0.8 0.6  --  0.3 0.6  ?PROT 7.2 6.5  --  5.8* 5.8*  ?ALBUMIN 3.8 3.0*  --  2.7* 2.7*  ?INR  --   --  1.1  --   --   ? ? ? ?INFECTIOUS ?Recent Labs  ?Lab 12/15/21 ?2236 12/16/21 ?0235 12/16/21 ?1308 12/17/21 ?2263 12/18/21 ?3354  ?LATICACIDVEN 0.9 1.2  --   --   --   ?PROCALCITON   --   --  <0.10 <0.10 <0.10  ? ? ? ?ENDOCRINE ?CBG (last 3)  ?Recent Labs  ?  12/17/21 ?2113 12/18/21 ?0824 12/18/21 ?1213  ?GLUCAP 144* 165* 156*  ? ? ? ? ? ? ? ?IMAGING x48h  - image(s) personally visualized  -   highlighted in bold ?MR ABDOMEN W WO CONTRAST ? ?Result Date: 12/18/2021 ?CLINICAL DATA:  71 year old female with history of indeterminate liver lesion noted on recent CT examination. Follow-up study. EXAM: MRI ABDOMEN WITHOUT AND WITH CONTRAST TECHNIQUE: Multiplanar multisequence MR imaging  of the abdomen was performed both before and after the administration of intravenous contrast. CONTRAST:  60m GADAVIST GADOBUTROL 1 MMOL/ML IV SOLN COMPARISON:  No prior abdominal MRI. CT the abdomen and pelvis 12/15/2021. FINDINGS: Lower chest: Nodular area with peripheral high T2 signal intensity in the right lower lobe, poorly evaluated on today's magnetic resonance examination, but potentially a cavitary nodule which remains concerning for potential septic embolization, as better demonstrated on prior chest CT. Hepatobiliary: In the area of concern in segment 8 of the liver on the prior CT examination, there is no definite suspicious cystic or solid hepatic lesion on today's magnetic resonance examination. No intra or extrahepatic biliary ductal dilatation. Gallbladder is normal in appearance. Common bile duct measures 5 mm in the porta hepatis. No filling defect within the common bile duct to suggest choledocholithiasis. Pancreas: No pancreatic mass. No pancreatic ductal dilatation. No pancreatic or peripancreatic fluid collections or inflammatory changes. Spleen:  Unremarkable. Adrenals/Urinary Tract: Multiple T1 hypointense, T2 hyperintense, nonenhancing lesions in both kidneys are compatible with simple cysts, largest of which is exophytic in the upper pole of the left kidney measuring 2.2 cm in diameter. No suspicious renal lesions. No hydroureteronephrosis in the visualized portions of the abdomen. Bilateral  adrenal glands are normal in appearance. Stomach/Bowel: Visualized portions are unremarkable. Vascular/Lymphatic: No aneurysm identified in the visualized abdominal vasculature. No lymphadenopathy noted in

## 2021-12-18 NOTE — Progress Notes (Signed)
?PROGRESS NOTE ? ? ? ?Nichole Santiago  WCB:[id number] DOB: 30-Apr-1951 DOA: 12/15/2021 ?PCP: Chesley Noon, MD  ?Chief Complaint  ?Patient presents with  ? Abdominal Pain  ? ? ?Brief Narrative:  ?Nichole Santiago is Nichole Santiago 71 y.o. female with medical history significant for type 1 diabetes mellitus on insulin pump, hypertension, hyperlipidemia, rheumatoid arthritis on chronic immunosuppressive therapy, who is admitted to [facility] on 12/15/2021 by way of transfer from [facility] emergency department for further evaluation management of erosive changes noted at T11/T12 after presenting from home to the latter facility for further evaluation of potential gallbladder mass identified on outpatient ultrasound.  Imaging has showed multiple pulmonary nodules of unclear significance. ? ?See below for additional details ?   ? ? ?Assessment & Plan: ?  ?Principal Problem: ?  Liver lesion, right lobe ?Active Problems: ?  Multiple pulmonary nodules ?  Abdominal pain ?  Dehydration ?  Diabetes mellitus without complication ([place]) ?  HLD (hyperlipidemia) ?  HTN (hypertension) ?  RA (rheumatoid arthritis) ([place]) ? ? ?Assessment and Plan: ?* Liver lesion, right lobe ?No suspicious hepatic lesion ?Suspect RUQ pain related to cavitary nodule in RLL ? ?Multiple pulmonary nodules ?CT abd/pelvis yesterday showed several pulmonary nodules scattered throughout R lung base ?CT showed numerous pulmonary nodules with basilar and subpleural predilection, many cavitary in nature.   ?CT chest was concerning for discitis/osteomyelitis at T11-12, but an MRI of the thoracic spine ruled that out (MR showed advanced degenerative disc disease with 2 large disc extrusions at T11-12 resulting in moderate spinal canal stenosis and cord deformity without abnormal cord signal.  Moderate bilateral neuroforaminal stenosis at T10-11 and T11-12). ?Inflammatory markers not that impressive, sed rate 60.  CRP wnl.  Procalcitonin is negative.  ?Blood cultures  pending ?Echo with normal EF ?Holding antibiotics for now, will follow ?Differential is broad for pulm nodules -> septic emboli vs mets vs due to rheumatoid vs etc ?Pulmonary has been consulted -> GBM, anca, quant gold, fungitell pending, considering bronch, appreciate assistance ? ? ?Dehydration ?Will continue to monitor with IVF ? ? ? ? ? ?Abdominal pain ?RUQ abdominal pain, based on CT, suspect maybe related to the posteriomedial RLL cavitary nodule ?Has 1 cm hyperdense liver lesion,MRI without suspicious hepatic lesion ?Pain management issue now, will continue to address and adjust prn ? ?Diabetes mellitus without complication ([place]) ?Will start basal/bolus insulin with SSI ?Continue to monitor ?Typically uses insulin pump, but it wasn't on her this morning -> "would not restart pump until pt sees endo after d/c" per 4/7 diabetes coordinator note - last saw endo 2 years ago ? ? ? ? ?HLD (hyperlipidemia) ?crestor ? ? ?HTN (hypertension) ?Metoprolol ?Losartan on hold ? ? ?RA (rheumatoid arthritis) ([place]) ?Continue arava ? ? ? ?DVT prophylaxis: SCD ?Code Status: full ?Family Communication: daughters at bedside ?Disposition:  ? ?Status is: Inpatient ?Remains inpatient appropriate because: continued w/u pulmonary nodules ?  ?Consultants:  ?pulm ? ?Procedures:  ?none ? ?Antimicrobials:  ?Anti-infectives (From admission, onward)  ? ? Start     Dose/Rate Route Frequency Ordered Stop  ? 12/16/21 2200  cefTRIAXone (ROCEPHIN) 2 g in sodium chloride 0.9 % 100 mL IVPB  Status:  Discontinued       ? 2 g ?200 mL/hr over 30 Minutes Intravenous Every 24 hours 12/16/21 0510 12/16/21 0949  ? 12/16/21 2200  vancomycin (VANCOREADY) IVPB 1250 mg/250 mL  Status:  Discontinued       ? 1,250 mg ?166.7 mL/hr over  90 Minutes Intravenous Every 24 hours 12/16/21 0519 12/16/21 0817  ? 12/16/21 2200  vancomycin (VANCOCIN) IVPB 1000 mg/200 mL premix  Status:  Discontinued       ? 1,000 mg ?200 mL/hr over 60 Minutes Intravenous Every 24 hours  12/16/21 0817 12/16/21 0949  ? 12/15/21 2230  cefTRIAXone (ROCEPHIN) 2 g in sodium chloride 0.9 % 100 mL IVPB       ? 2 g ?200 mL/hr over 30 Minutes Intravenous  Once 12/15/21 2218 12/15/21 2345  ? 12/15/21 2230  vancomycin (VANCOCIN) IVPB 1000 mg/200 mL premix       ? 1,000 mg ?200 mL/hr over 60 Minutes Intravenous  Once 12/15/21 2218 12/16/21 0019  ? ?  ? ? ?Subjective: ?C/o pain, improved with IV pain med she recently got ? ?Objective: ?Vitals:  ? 12/17/21 2045 12/17/21 2048 12/18/21 0431 12/18/21 0435  ?BP: (!) 147/66   137/60  ?Pulse: 66 66  62  ?Resp: 18   16  ?Temp: 98.1 ?F (36.7 ?C)   98.1 ?F (36.7 ?C)  ?TempSrc: Oral   Oral  ?SpO2: (!) 84% 90%  91%  ?Weight:   59.8 kg   ?Height:      ? ? ?Intake/Output Summary (Last 24 hours) at 12/18/2021 1323 ?Last data filed at 12/17/2021 2047 ?Gross per 24 hour  ?Intake 300 ml  ?Output --  ?Net 300 ml  ? ?Filed Weights  ? 12/16/21 0446 12/17/21 0428 12/18/21 0431  ?Weight: 56 kg 60.7 kg 59.8 kg  ? ? ?Examination: ? ?General: No acute distress. ?Cardiovascular: RRR ?Lungs: unlabored ?Abdomen: Soft, nontender, nondistended  ?Neurological: Alert and oriented ?3. Moves all extremities ?4 . Cranial nerves II through XII grossly intact. ?Skin: Warm and dry. No rashes or lesions. ?Extremities: No clubbing or cyanosis. No edema ? ?Data Reviewed: I have personally reviewed following labs and imaging studies ? ?CBC: ?Recent Labs  ?Lab 12/15/21 ?1656 12/16/21 ?0235 12/17/21 ?4132 12/18/21 ?4401  ?WBC 5.5 5.7 5.4 5.7  ?NEUTROABS  --  3.1 3.1 3.3  ?HGB 13.0 11.5* 10.2* 10.6*  ?HCT 39.3 35.0* 31.6* 32.2*  ?MCV 92.9 95.4 96.3 95.5  ?PLT 141* 122* 117* 107*  ? ? ?Basic Metabolic Panel: ?Recent Labs  ?Lab 12/15/21 ?1656 12/16/21 ?0235 12/17/21 ?0272 12/18/21 ?5366  ?NA 139 140 139 140  ?K 3.8 4.0 3.9 3.7  ?CL 104 107 106 105  ?CO2 24 26 27 29   ?GLUCOSE 152* 169* 163* 143*  ?BUN 13 11 11 10   ?CREATININE 0.49 0.50 0.60 0.47  ?CALCIUM 9.6 8.7* 8.7* 8.5*  ?MG  --  1.8 1.9 1.9  ?PHOS  --   --   3.1 2.5  ? ? ?GFR: ?Estimated Creatinine Clearance: 61.8 mL/min (by C-G formula based on SCr of 0.47 mg/dL). ? ?Liver Function Tests: ?Recent Labs  ?Lab 12/15/21 ?1656 12/16/21 ?0235 12/17/21 ?4403 12/18/21 ?4742  ?AST 13* 13* 14* 14*  ?ALT 9 11 10 10   ?ALKPHOS 82 71 65 62  ?BILITOT 0.8 0.6 0.3 0.6  ?PROT 7.2 6.5 5.8* 5.8*  ?ALBUMIN 3.8 3.0* 2.7* 2.7*  ? ? ?CBG: ?Recent Labs  ?Lab 12/17/21 ?1231 12/17/21 ?1818 12/17/21 ?2113 12/18/21 ?5956 12/18/21 ?1213  ?GLUCAP 164* 95 144* 165* 156*  ? ? ? ?Recent Results (from the past 240 hour(s))  ?Culture, blood (routine x 2)     Status: None (Preliminary result)  ? Collection Time: 12/15/21 10:36 PM  ? Specimen: BLOOD LEFT FOREARM  ?Result Value Ref Range Status  ? Specimen  Description   Final  ?  BLOOD LEFT FOREARM ?Performed at [facility], [place] [address] [place], [place] [address] ?  ? Special Requests   Final  ?  BLOOD LEFT FOREARM ?Performed at [facility], [address] [place], [place] [address] ?  ? Culture   Final  ?  NO GROWTH 1 DAY ?Performed at [facility], [place] [address], [place], [place] [address] ?  ? Report Status PENDING  Incomplete  ?Culture, blood (routine x 2)     Status: None (Preliminary result)  ? Collection Time: 12/15/21 10:36 PM  ? Specimen: BLOOD  ?Result Value Ref Range Status  ? Specimen Description   Final  ?  BLOOD Blood Culture adequate volume ?Performed at [facility], [address], [place], [place] [address] ?  ? Special Requests   Final  ?  BLOOD LEFT HAND BOTTLES DRAWN AEROBIC AND ANAEROBIC  ? Culture   Final  ?  NO GROWTH 1 DAY ?Performed at [facility], [place] [address] [place],  [address] ?  ? Report Status PENDING  Incomplete  ?Resp Panel by RT-PCR (Flu Shonte Soderlund&B, Covid) Nasopharyngeal Swab     Status: None  ? Collection Time: 12/15/21 11:20 PM  ? Specimen: Nasopharyngeal Swab; Nasopharyngeal(NP) swabs in vial transport medium  ?Result Value Ref Range Status  ? SARS  Coronavirus 2 by RT PCR NEGATIVE NEGATIVE Final  ?  Comment: (NOTE) ?SARS-CoV-2 target nucleic acids are NOT DETECTED. ? ?The SARS-CoV-2 RNA is generally detectable in upper respiratory ?specimens during the acut

## 2021-12-18 NOTE — Evaluation (Signed)
Physical Therapy Evaluation ?Patient Details ?Name: Nichole Santiago ?MRN: [id number] ?DOB: 25-Dec-1950 ?Today's Date: 12/18/2021 ? ?History of Present Illness ? Pt is 71 yo female admitted on 12/15/21 with liver lesion and multiple pulmonary nodules.  Pt with hx of smoking, DM type 1 on insuling pump, HTN, HLD, RA on immunosuppressive therapy.  ?Clinical Impression ? Pt admitted with above diagnosis.  She lives with her daughter.  Pt was independent with mobility but did have assist with ADLs at baseline.  Today , pt able to ambulate 200' with RW safely and VSS.  She feels she is at her baseline mobility.  PT will sign off as pt at baseline.  Pt's daughter did ask about transport chair for community mobility.     ?   ? ?Recommendations for follow up therapy are one component of a multi-disciplinary discharge planning process, led by the attending physician.  Recommendations may be updated based on patient status, additional functional criteria and insurance authorization. ? ?Follow Up Recommendations No PT follow up ? ?  ?Assistance Recommended at Discharge Intermittent Supervision/Assistance  ?Patient can return home with the following ? A little help with walking and/or transfers;A little help with bathing/dressing/bathroom;Help with stairs or ramp for entrance ? ?  ?Equipment Recommendations Other (comment) (daughter intereseted to see if  transport chair for community mobility would be covered by insurance)  ?Recommendations for Other Services ?    ?  ?Functional Status Assessment Patient has not had a recent decline in their functional status  ? ?  ?Precautions / Restrictions Precautions ?Precautions: Fall  ? ?  ? ?Mobility ? Bed Mobility ?Overal bed mobility: Needs Assistance ?Bed Mobility: Supine to Sit, Sit to Supine ?  ?  ?Supine to sit: Supervision ?Sit to supine: Supervision ?  ?  ?  ? ?Transfers ?Overall transfer level: Needs assistance ?Equipment used: Rolling walker (2 wheels) ?Transfers: Sit to/from Stand ?Sit  to Stand: Supervision ?  ?  ?  ?  ?  ?  ?  ? ?Ambulation/Gait ?Ambulation/Gait assistance: Supervision, Min guard ?Gait Distance (Feet): 200 Feet ?Assistive device: Rolling walker (2 wheels) ?Gait Pattern/deviations: Step-through pattern, Decreased stride length ?Gait velocity: decreased ?  ?  ?General Gait Details: Min guard progressed to supervision; steady gait ? ?Stairs ?  ?  ?  ?  ?  ? ?Wheelchair Mobility ?  ? ?Modified Rankin (Stroke Patients Only) ?  ? ?  ? ?Balance Overall balance assessment: Needs assistance ?Sitting-balance support: No upper extremity supported ?Sitting balance-Leahy Scale: Good ?  ?  ?Standing balance support: No upper extremity supported, Bilateral upper extremity supported ?Standing balance-Leahy Scale: Fair ?Standing balance comment: could static stand and take a few steps without AD; used RW in hallway ?  ?  ?  ?  ?  ?  ?  ?  ?  ?  ?  ?   ? ? ? ?Pertinent Vitals/Pain Pain Assessment ?Pain Assessment: No/denies pain  ? ? ?Home Living Family/patient expects to be discharged to:: Private residence ?Living Arrangements: Children ?Available Help at Discharge: Family;Available 24 hours/day ?Type of Home: House ?Home Access: Level entry;Ramped entrance ?  ?  ?  ?Home Layout: One level ?Home Equipment: Conservation officer, nature (2 wheels) ?   ?  ?Prior Function Prior Level of Function : Needs assist ?  ?  ?  ?  ?  ?  ?Mobility Comments: Ambulates in house and short community distances (appointments) without AD but daughter reports unstable.  Last fall was in December. ?  ADLs Comments: Daughter assisted with bathing and dressing.  Pt could do toileting ?  ? ? ?Hand Dominance  ?   ? ?  ?Extremity/Trunk Assessment  ? Upper Extremity Assessment ?Upper Extremity Assessment: Overall WFL for tasks assessed ?  ? ?Lower Extremity Assessment ?Lower Extremity Assessment: Overall WFL for tasks assessed ?  ? ?Cervical / Trunk Assessment ?Cervical / Trunk Assessment: Normal  ?Communication  ?    ?Cognition  Arousal/Alertness: Awake/alert ?Behavior During Therapy: [facility] for tasks assessed/performed ?Overall Cognitive Status: Within Functional Limits for tasks assessed ?  ?  ?  ?  ?  ?  ?  ?  ?  ?  ?  ?  ?  ?  ?  ?  ?  ?  ?  ? ?  ?General Comments General comments (skin integrity, edema, etc.): Daughter asking about exercises: advised general AROM exercises as well as sit to stands and heel raises.  Daughter also interested in transport chair for community ? ?  ?Exercises    ? ?Assessment/Plan  ?  ?PT Assessment Patient needs continued PT services  ?PT Problem List   ? ?   ?  ?PT Treatment Interventions     ? ?PT Goals (Current goals can be found in the Care Plan section)  ?Acute Rehab PT Goals ?Patient Stated Goal: return home ?PT Goal Formulation: All assessment and education complete, DC therapy ? ?  ?Frequency   ?  ? ? ?Co-evaluation   ?  ?  ?  ?  ? ? ?  ?AM-PAC PT "6 Clicks" Mobility  ?Outcome Measure Help needed turning from your back to your side while in a flat bed without using bedrails?: None ?Help needed moving from lying on your back to sitting on the side of a flat bed without using bedrails?: None ?Help needed moving to and from a bed to a chair (including a wheelchair)?: A Little ?Help needed standing up from a chair using your arms (e.g., wheelchair or bedside chair)?: A Little ?Help needed to walk in hospital room?: A Little ?Help needed climbing 3-5 steps with a railing? : A Little ?6 Click Score: 20 ? ?  ?End of Session   ?Activity Tolerance: Patient tolerated treatment well ?Patient left: in bed;with call bell/phone within reach;with bed alarm set;with family/visitor present ?Nurse Communication: Mobility status ?PT Visit Diagnosis: Other abnormalities of gait and mobility (R26.89) ?  ? ?Time: 1710-1726 ?PT Time Calculation (min) (ACUTE ONLY): 16 min ? ? ?Charges:   PT Evaluation ?$PT Eval Low Complexity: 1 Low ?  ?  ?   ? ? ?Abran Richard, PT ?Acute Rehab Services ?Pager [phone] ?[facility]  [phone] ? ? ?Mikael Spray [place] ?12/18/2021, 5:41 PM ? ?

## 2021-12-18 NOTE — Telephone Encounter (Signed)
Frnt desk ? ?Please arrange OPD PET Scan and then followup with ICard/Byrum for nodules (smoker  , rheumatoid an dimmune suppressed) ? ?[place] ? ? ? ?SIGNATURE  ? ? ?Dr. Kalman Shan, M.D., F.C.C.P,  ?Pulmonary and Critical Care Medicine ?Staff Physician, [facility] ?Center Director - Interstitial Lung Disease  Program  ?Medical Director - [facility] ?[facility] ?[place], [place], [address] ? ?NPI Number:  NPI #[id number] ?DEA Number: [id number] ? ?Pager: [phone], If no answer  -> Check AMION or Try [phone] ?Telephone (clinical office): [phone] ?Telephone (research): [phone] ? ?12:12 PM ?12/18/2021 ? ?

## 2021-12-19 DIAGNOSIS — D696 Thrombocytopenia, unspecified: Secondary | ICD-10-CM

## 2021-12-19 DIAGNOSIS — K769 Liver disease, unspecified: Secondary | ICD-10-CM | POA: Diagnosis not present

## 2021-12-19 DIAGNOSIS — I10 Essential (primary) hypertension: Secondary | ICD-10-CM | POA: Diagnosis not present

## 2021-12-19 DIAGNOSIS — E119 Type 2 diabetes mellitus without complications: Secondary | ICD-10-CM | POA: Diagnosis not present

## 2021-12-19 DIAGNOSIS — M05711 Rheumatoid arthritis with rheumatoid factor of right shoulder without organ or systems involvement: Secondary | ICD-10-CM

## 2021-12-19 DIAGNOSIS — R1011 Right upper quadrant pain: Secondary | ICD-10-CM | POA: Diagnosis not present

## 2021-12-19 LAB — COMPREHENSIVE METABOLIC PANEL
ALT: 10 U/L (ref 0–44)
AST: 17 U/L (ref 15–41)
Albumin: 2.7 g/dL — ABNORMAL LOW (ref 3.5–5.0)
Alkaline Phosphatase: 65 U/L (ref 38–126)
Anion gap: 5 (ref 5–15)
BUN: 12 mg/dL (ref 8–23)
CO2: 25 mmol/L (ref 22–32)
Calcium: 8.3 mg/dL — ABNORMAL LOW (ref 8.9–10.3)
Chloride: 109 mmol/L (ref 98–111)
Creatinine, Ser: 0.41 mg/dL — ABNORMAL LOW (ref 0.44–1.00)
GFR, Estimated: >60 mL/min (ref >60)
Glucose, Bld: 175 mg/dL — ABNORMAL HIGH (ref 70–99)
Potassium: 3.8 mmol/L (ref 3.5–5.1)
Sodium: 139 mmol/L (ref 135–145)
Total Bilirubin: 0.4 mg/dL (ref 0.3–1.2)
Total Protein: 5.8 g/dL — ABNORMAL LOW (ref 6.5–8.1)

## 2021-12-19 LAB — CBC WITH DIFFERENTIAL/PLATELET
Abs Immature Granulocytes: 0.03 10*3/uL (ref 0.00–0.07)
Basophils Absolute: 0.1 10*3/uL (ref 0.0–0.1)
Basophils Relative: 1 %
Eosinophils Absolute: 0.2 10*3/uL (ref 0.0–0.5)
Eosinophils Relative: 4 %
HCT: 33.2 % — ABNORMAL LOW (ref 36.0–46.0)
Hemoglobin: 11.1 g/dL — ABNORMAL LOW (ref 12.0–15.0)
Immature Granulocytes: 1 %
Lymphocytes Relative: 21 %
Lymphs Abs: 1.2 10*3/uL (ref 0.7–4.0)
MCH: 31.7 pg (ref 26.0–34.0)
MCHC: 33.4 g/dL (ref 30.0–36.0)
MCV: 94.9 fL (ref 80.0–100.0)
Monocytes Absolute: 0.8 10*3/uL (ref 0.1–1.0)
Monocytes Relative: 15 %
Neutro Abs: 3.3 10*3/uL (ref 1.7–7.7)
Neutrophils Relative %: 58 %
Platelets: 112 10*3/uL — ABNORMAL LOW (ref 150–400)
RBC: 3.5 MIL/uL — ABNORMAL LOW (ref 3.87–5.11)
RDW: 13.8 % (ref 11.5–15.5)
WBC: 5.6 10*3/uL (ref 4.0–10.5)
nRBC: 0 % (ref 0.0–0.2)

## 2021-12-19 LAB — HEPATITIS B SURFACE ANTIGEN: Hepatitis B Surface Ag: NONREACTIVE

## 2021-12-19 LAB — CRYPTOCOCCAL ANTIGEN: Crypto Ag: NEGATIVE

## 2021-12-19 LAB — LEGIONELLA PNEUMOPHILA SEROGP 1 UR AG: L. pneumophila Serogp 1 Ur Ag: NEGATIVE

## 2021-12-19 LAB — GLUCOSE, CAPILLARY
Glucose-Capillary: 170 mg/dL — ABNORMAL HIGH (ref 70–99)
Glucose-Capillary: 115 mg/dL — ABNORMAL HIGH (ref 70–99)
Glucose-Capillary: 120 mg/dL — ABNORMAL HIGH (ref 70–99)
Glucose-Capillary: 215 mg/dL — ABNORMAL HIGH (ref 70–99)

## 2021-12-19 NOTE — Progress Notes (Signed)
? ?NAME:  Nichole Santiago, MRN:  [id number], DOB:  12-Jun-1951, LOS: 3 ?ADMISSION DATE:  12/15/2021, CONSULTATION DATE:  12/16/21 ?REFERRING MD:  Florene Glen - [facility], CHIEF COMPLAINT:  abnormal CT chest   ? ?History of Present Illness:  ?71 yo F PMH RA on avara, tobacco use, IDDM who presented to [facility] 4/6 with abdominal pain, and was admitted to [facility] to [facility] after [place] identified possible gallbladder mass and T11/T12 changes. A CT a/p was performed and this revealed a possible 1cm R liver dome lesion, focal wall thickening of fundal gallbladder, and unexpected RLL pulmonary nodules -- including some cavitary nodules up to 2cm in size. Received vanc and cefepime on 4/6. PCT on 4/7 is < 0.1 ? ?PCCM consulted in this setting  ? ?Pertinent  Medical History  ?Tobacco abuse ?RA ?HTN ?HLD ?IDDM ?Significant Hospital Events: ?Including procedures, antibiotic start and stop dates in addition to other pertinent events   ?4/6 [facility] with RUQ abdominal pain. Abnormal RUQ [place] -- T11 T12 changes, ? Gb lesion. CT a/p revealed ?1cm liver lesion as well as numerous RLL pulm nodules  ?4/7 admit to [facility]. Pulm consult  ? ?Interim History / Subjective:  ? ?No acute events overnight.  ?Patient continues to have intermittent right sided abdominal pains ?No cough, no sputum production ? ? ?Objective   ?Blood pressure 137/63, pulse 66, temperature 98.5 ?F (36.9 ?C), temperature source Oral, resp. rate 14, height _0  (1.727 m), weight 59.7 kg, SpO2 96 %. ?   ?   ? ?Intake/Output Summary (Last 24 hours) at 12/19/2021 1829 ?Last data filed at 12/19/2021 1700 ?Gross per 24 hour  ?Intake 120 ml  ?Output 1350 ml  ?Net -1230 ml  ? ? ?Filed Weights  ? 12/17/21 0428 12/18/21 0431 12/19/21 0635  ?Weight: 60.7 kg 59.8 kg 59.7 kg  ? ? ?Examination: ?General: Chronically ill woman, no acute distress ?HENT: NCAT pink mm ?Lungs: clear to auscultation bilaterally, no wheezing ?Cardiovascular: rrr, no murmurs ?Abdomen: soft, non-tender, non-distended,  BS+ ?Extremities: warm, no edema ?Neuro: AAO 3 ?GU: defer  ? ?Resolved Hospital Problem list   ? ? ?Assessment & Plan:  ? ?Pulmonary nodules RLL, with some cavitary appearing nodules  ?Tobacco abuse disorder ?Emphysema ?-CT chest personally reviewed ?- Differential includes infection vs inflammatory etiology and less likely malignancy ?- LDH is not elevated. Fungitell is pending along with further workup per ID ?- Recommend obtaining TEE to rule out endocarditis ?- I do not feel PET scan will give [place] much more information at this time prior to moving forward with a bronchoscopy with BAL or navigational bronchoscopy ?- Inflammatory workup is pending ?- smoking cessation advised ?- Continue spiriva, prn nebs ? ? ?T11/T12 degenerative changes, with 2 large disc extrusions, moderate spinal canal stenosis  ?-MRI ruled out discitis  ? ?RUQ pain ?-1cm R liver dome lesion  ?P ?-PRN dilaudid  ? ?RA on arava ?P ?-cont avara ? ?IDDM ?-SSI ? ? ?Best Practice (right click and "Reselect all SmartList Selections" daily)  ? ?Per Primary ? ?Labs   ?CBC: ?Recent Labs  ?Lab 12/15/21 ?1656 12/16/21 ?0235 12/17/21 ?5597 12/18/21 ?4163 12/19/21 ?0818  ?WBC 5.5 5.7 5.4 5.7 5.6  ?NEUTROABS  --  3.1 3.1 3.3 3.3  ?HGB 13.0 11.5* 10.2* 10.6* 11.1*  ?HCT 39.3 35.0* 31.6* 32.2* 33.2*  ?MCV 92.9 95.4 96.3 95.5 94.9  ?PLT 141* 122* 117* 107* 112*  ? ? ? ?Basic Metabolic Panel: ?Recent Labs  ?Lab 12/15/21 ?1656  12/16/21 ?0235 12/17/21 ?4497 12/18/21 ?5300 12/19/21 ?0818  ?NA 139 140 139 140 139  ?K 3.8 4.0 3.9 3.7 3.8  ?CL 104 107 106 105 109  ?CO2 _0 ?GLUCOSE 152* 169* 163* 143* 175*  ?BUN _1 ?CREATININE 0.49 0.50 0.60 0.47 0.41*  ?CALCIUM 9.6 8.7* 8.7* 8.5* 8.3*  ?MG  --  1.8 1.9 1.9  --   ?PHOS  --   --  3.1 2.5  --   ? ? ?GFR: ?Estimated Creatinine Clearance: 61.7 mL/min (A) (by C-G formula based on SCr of 0.41 mg/dL (L)). ?Recent Labs  ?Lab 12/15/21 ?2236 12/16/21 ?0235 12/16/21 ?1308 12/17/21 ?5110 12/18/21 ?2111  12/19/21 ?0818  ?PROCALCITON  --   --  <0.10 <0.10 <0.10  --   ?WBC  --  5.7  --  5.4 5.7 5.6  ?LATICACIDVEN 0.9 1.2  --   --   --   --   ? ? ? ?Liver Function Tests: ?Recent Labs  ?Lab 12/15/21 ?1656 12/16/21 ?0235 12/17/21 ?7356 12/18/21 ?7014 12/19/21 ?0818  ?AST 13* 13* 14* 14* 17  ?ALT _2 ?ALKPHOS 82 71 65 62 65  ?BILITOT 0.8 0.6 0.3 0.6 0.4  ?PROT 7.2 6.5 5.8* 5.8* 5.8*  ?ALBUMIN 3.8 3.0* 2.7* 2.7* 2.7*  ? ? ?Recent Labs  ?Lab 12/15/21 ?1656  ?LIPASE <10*  ? ? ?No results for input(s): AMMONIA in the last 168 hours. ? ?ABG ?   ?Component Value Date/Time  ? HCO3 19.4 (L) 10/18/2019 1852  ? ACIDBASEDEF 4.8 (H) 10/18/2019 1852  ? O2SAT 52.3 10/18/2019 1852  ? ?  ? ?Coagulation Profile: ?Recent Labs  ?Lab 12/16/21 ?1030  ?INR 1.1  ? ? ? ?Cardiac Enzymes: ?No results for input(s): CKTOTAL, CKMB, CKMBINDEX, TROPONINI in the last 168 hours. ? ?HbA1C: ?Hgb A1c MFr Bld  ?Date/Time Value Ref Range Status  ?12/16/2021 07:10 AM 7.1 (H) 4.8 - 5.6 % Final  ?  Comment:  ?  (NOTE) ?Pre diabetes:          5.7%-6.4% ? ?Diabetes:              >6.4% ? ?Glycemic control for   <7.0% ?adults with diabetes ?  ? ? ?CBG: ?Recent Labs  ?Lab 12/18/21 ?1706 12/18/21 ?2127 12/19/21 ?0726 12/19/21 ?1152 12/19/21 ?1721  ?[place]  ? ? ?Freda Jackson, MD ?[facility] ?Office: [phone] ? ? ?See Amion for personal pager ?PCCM on call pager [phone] until 7pm. ?Please call Elink 7p-7a. [phone] ? ? ? ? ? ? ?

## 2021-12-19 NOTE — H&P (View-Only) (Signed)
?PROGRESS NOTE ? ? ? ?Nichole Santiago  JSE:[id number] DOB: Jan 21, 1951 DOA: 12/15/2021 ?PCP: Chesley Noon, MD  ?Chief Complaint  ?Patient presents with  ? Abdominal Pain  ? ? ?Brief Narrative:  ?SHALVA ROZYCKI is Nichole Santiago 71 y.o. female with medical history significant for type 1 diabetes mellitus on insulin pump, hypertension, hyperlipidemia, rheumatoid arthritis on chronic immunosuppressive therapy, who is admitted to [facility] on 12/15/2021 by way of transfer from [facility] emergency department for further evaluation management of erosive changes noted at T11/T12 after presenting from home to the latter facility for further evaluation of potential gallbladder mass identified on outpatient ultrasound.  Imaging has showed multiple pulmonary nodules of unclear significance. ? ?See below for additional details ?   ? ? ?Assessment & Plan: ?  ?Principal Problem: ?  Liver lesion, right lobe ?Active Problems: ?  Multiple pulmonary nodules ?  Abdominal pain ?  Thrombocytopenia ([place]) ?  Dehydration ?  Diabetes mellitus without complication ([place]) ?  HLD (hyperlipidemia) ?  HTN (hypertension) ?  RA (rheumatoid arthritis) ([place]) ? ? ?Assessment and Plan: ?* Liver lesion, right lobe ?No suspicious hepatic lesion ?Suspect RUQ pain related to cavitary nodule in RLL ? ?Multiple pulmonary nodules ?CT abd/pelvis yesterday showed several pulmonary nodules scattered throughout R lung base ?CT showed numerous pulmonary nodules with basilar and subpleural predilection, many cavitary in nature.   ?CT chest was concerning for discitis/osteomyelitis at T11-12, but an MRI of the thoracic spine ruled that out (MR showed advanced degenerative disc disease with 2 large disc extrusions at T11-12 resulting in moderate spinal canal stenosis and cord deformity without abnormal cord signal.  Moderate bilateral neuroforaminal stenosis at T10-11 and T11-12). ?Inflammatory markers not that impressive, sed rate 60.  CRP wnl.  Procalcitonin is  negative.  ?Blood cultures NG ?Echo with normal EF ?Holding antibiotics for now, will follow ?Awaiting TEE ?ID c/s, appreciate recs -> follow TEE, follow urine histo ag, blastomyces ag, cryptococcal ag, HIV, hep B, hep c testing.  If bronch, need AFB and fungal cx ?Differential is broad for pulm nodules -> septic emboli vs mets vs due to rheumatoid vs etc ?Pulmonary has been consulted -> GBM, anca, quant gold, fungitell pending, considering bronch, appreciate assistance ? ? ?Abdominal pain ?RUQ abdominal pain, based on CT, suspect maybe related to the posteriomedial RLL cavitary nodule ?Has 1 cm hyperdense liver lesion,MRI without suspicious hepatic lesion ?Pain management issue now, will continue to address and adjust prn ? ?Thrombocytopenia ([place]) ?Gradual downtrend, possibly dilutional ?Some degree of chronicity, mild in 2021 ?Will follow ? ?Diabetes mellitus without complication ([place]) ?Will start basal/bolus insulin with SSI ?Continue to monitor ?Typically uses insulin pump, but it wasn't on her this morning -> "would not restart pump until pt sees endo after d/c" per 4/7 diabetes coordinator note - last saw endo 2 years ago ? ? ? ? ?Dehydration ?Will continue to monitor with IVF ? ? ? ? ? ?HLD (hyperlipidemia) ?crestor ? ? ?HTN (hypertension) ?Metoprolol ?Losartan on hold ? ? ?RA (rheumatoid arthritis) ([place]) ?Continue arava ? ? ? ?DVT prophylaxis: SCD ?Code Status: full ?Family Communication: daughter Marye Round 4/9 and 4/10 by phone ?Disposition:  ? ?Status is: Inpatient ?Remains inpatient appropriate because: continued w/u pulmonary nodules ?  ?Consultants:  ?pulm ? ?Procedures:  ?none ? ?Antimicrobials:  ?Anti-infectives (From admission, onward)  ? ? Start     Dose/Rate Route Frequency Ordered Stop  ? 12/16/21 2200  cefTRIAXone (ROCEPHIN) 2 g in sodium chloride 0.9 % 100  mL IVPB  Status:  Discontinued       ? 2 g ?200 mL/hr over 30 Minutes Intravenous Every 24 hours 12/16/21 0510 12/16/21 0949  ? 12/16/21 2200   vancomycin (VANCOREADY) IVPB 1250 mg/250 mL  Status:  Discontinued       ? 1,250 mg ?166.7 mL/hr over 90 Minutes Intravenous Every 24 hours 12/16/21 0519 12/16/21 0817  ? 12/16/21 2200  vancomycin (VANCOCIN) IVPB 1000 mg/200 mL premix  Status:  Discontinued       ? 1,000 mg ?200 mL/hr over 60 Minutes Intravenous Every 24 hours 12/16/21 0817 12/16/21 0949  ? 12/15/21 2230  cefTRIAXone (ROCEPHIN) 2 g in sodium chloride 0.9 % 100 mL IVPB       ? 2 g ?200 mL/hr over 30 Minutes Intravenous  Once 12/15/21 2218 12/15/21 2345  ? 12/15/21 2230  vancomycin (VANCOCIN) IVPB 1000 mg/200 mL premix       ? 1,000 mg ?200 mL/hr over 60 Minutes Intravenous  Once 12/15/21 2218 12/16/21 0019  ? ?  ? ? ?Subjective: ?No new complaints ? ?Objective: ?Vitals:  ? 12/19/21 0616 12/19/21 0635 12/19/21 0750 12/19/21 1325  ?BP: (!) 146/71   137/63  ?Pulse: 61   66  ?Resp: 16   14  ?Temp: 98.4 ?F (36.9 ?C)   98.5 ?F (36.9 ?C)  ?TempSrc: Oral   Oral  ?SpO2: 96%  97% 96%  ?Weight:  59.7 kg    ?Height:      ? ? ?Intake/Output Summary (Last 24 hours) at 12/19/2021 1841 ?Last data filed at 12/19/2021 1700 ?Gross per 24 hour  ?Intake 120 ml  ?Output 1350 ml  ?Net -1230 ml  ? ?Filed Weights  ? 12/17/21 0428 12/18/21 0431 12/19/21 0635  ?Weight: 60.7 kg 59.8 kg 59.7 kg  ? ? ?Examination: ? ?General: No acute distress. ?Cardiovascular: RRR ?Lungs: unlabored ?Abdomen: Soft, nontender, nondistended  ?Neurological: Alert and oriented ?3. Moves all extremities ?4 w. Cranial nerves II through XII grossly intact. ?Skin: Warm and dry. No rashes or lesions. ?Extremities: No clubbing or cyanosis. No edema. ? ?Data Reviewed: I have personally reviewed following labs and imaging studies ? ?CBC: ?Recent Labs  ?Lab 12/15/21 ?1656 12/16/21 ?0235 12/17/21 ?7628 12/18/21 ?3151 12/19/21 ?0818  ?WBC 5.5 5.7 5.4 5.7 5.6  ?NEUTROABS  --  3.1 3.1 3.3 3.3  ?HGB 13.0 11.5* 10.2* 10.6* 11.1*  ?HCT 39.3 35.0* 31.6* 32.2* 33.2*  ?MCV 92.9 95.4 96.3 95.5 94.9  ?PLT 141* 122* 117*  107* 112*  ? ? ?Basic Metabolic Panel: ?Recent Labs  ?Lab 12/15/21 ?1656 12/16/21 ?0235 12/17/21 ?7616 12/18/21 ?0737 12/19/21 ?0818  ?NA 139 140 139 140 139  ?K 3.8 4.0 3.9 3.7 3.8  ?CL 104 107 106 105 109  ?CO2 24 26 27 29 25   ?GLUCOSE 152* 169* 163* 143* 175*  ?BUN 13 11 11 10 12   ?CREATININE 0.49 0.50 0.60 0.47 0.41*  ?CALCIUM 9.6 8.7* 8.7* 8.5* 8.3*  ?MG  --  1.8 1.9 1.9  --   ?PHOS  --   --  3.1 2.5  --   ? ? ?GFR: ?Estimated Creatinine Clearance: 61.7 mL/min (Milferd Ansell) (by C-G formula based on SCr of 0.41 mg/dL (L)). ? ?Liver Function Tests: ?Recent Labs  ?Lab 12/15/21 ?1656 12/16/21 ?0235 12/17/21 ?1062 12/18/21 ?6948 12/19/21 ?0818  ?AST 13* 13* 14* 14* 17  ?ALT 9 11 10 10 10   ?ALKPHOS 82 71 65 62 65  ?BILITOT 0.8 0.6 0.3 0.6 0.4  ?PROT 7.2 6.5  5.8* 5.8* 5.8*  ?ALBUMIN 3.8 3.0* 2.7* 2.7* 2.7*  ? ? ?CBG: ?Recent Labs  ?Lab 12/18/21 ?1706 12/18/21 ?2127 12/19/21 ?0726 12/19/21 ?1152 12/19/21 ?1721  ?GLUCAP 113* 119* 170* 115* 120*  ? ? ? ?Recent Results (from the past 240 hour(s))  ?Culture, blood (routine x 2)     Status: None (Preliminary result)  ? Collection Time: 12/15/21 10:36 PM  ? Specimen: BLOOD LEFT FOREARM  ?Result Value Ref Range Status  ? Specimen Description   Final  ?  BLOOD LEFT FOREARM ?Performed at [facility], [place] [address], [place], [place] [address] ?  ? Special Requests   Final  ?  BLOOD LEFT FOREARM ?Performed at [facility], [address] [place], [place] [address] ?  ? Culture   Final  ?  NO GROWTH 3 DAYS ?Performed at [facility], [place] [address] [place], [place] [address] ?  ? Report Status PENDING  Incomplete  ?Culture, blood (routine x 2)     Status: None (Preliminary result)  ? Collection Time: 12/15/21 10:36 PM  ? Specimen: BLOOD  ?Result Value Ref Range Status  ? Specimen Description   Final  ?  BLOOD Blood Culture adequate volume ?Performed at [facility], [address] [place], [place] [address] ?  ? Special Requests    Final  ?  BLOOD LEFT HAND BOTTLES DRAWN AEROBIC AND ANAEROBIC  ? Culture   Final  ?  NO GROWTH 3 DAYS ?Performed at [facility], [place] [address] [place], [place] [address] ?  ? Report Status PENDING  [place]

## 2021-12-19 NOTE — Consult Note (Addendum)
Date of Admission:  12/15/2021          Reason for Consult: Cavitary lung nodules in the patient with rheumatoid arthritis    Referring Provider: Lacretia Nicks, MD   Assessment:  Cavitary lung nodules in pt with RA on leflonamide (but not on humira yet)\ RUQ pain New onset rash Bursal prominence right elbow DM RA Smoker Hyperlipidemia  Plan:  Will followup TEE results ?If patient is goint to have bronchoscopy with biopsies / BAL if so would be sure to send for cultures and most importantly from an ID standpoint AFB and fungal cultures to look for nontuberculous mycobacteria dimorphic fungi and nocardia Will send urine histoplasma ag, blastomyces ag, cryptococcal ag in serum and insure she has been screened for HIV Dr Marchelle Gearing had mentioned getting PET scan--not sure if he wishes to pursue this first Not clear what her rash which appears to be scarring after scratching (she denies) signifies but this is not c/w E nodosum  Principal Problem:   Liver lesion, right lobe Active Problems:   Abdominal pain   Multiple pulmonary nodules   Dehydration   Diabetes mellitus without complication (HCC)   RA (rheumatoid arthritis) (HCC)   HTN (hypertension)   HLD (hyperlipidemia)   Scheduled Meds:  insulin aspart  0-5 Units Subcutaneous QHS   insulin aspart  0-9 Units Subcutaneous TID WC   insulin aspart  3 Units Subcutaneous TID WC   insulin glargine-yfgn  5 Units Subcutaneous BID   leflunomide  10 mg Oral Daily   metoprolol succinate  12.5 mg Oral q morning   pregabalin  75 mg Oral BID   rosuvastatin  40 mg Oral QHS   sertraline  75 mg Oral Daily   umeclidinium bromide  1 puff Inhalation Daily   Continuous Infusions:  lactated ringers 75 mL/hr at 12/18/21 1218   PRN Meds:.acetaminophen **OR** acetaminophen, HYDROmorphone (DILAUDID) injection, ondansetron (ZOFRAN) IV, oxyCODONE  HPI: Nichole Santiago is a 71 y.o. female with history for type 1 diabetes mellitus on  insulin pump hypertension hyperlipidemia rheumatoid arthritis on leflonamide, with hx of smoking who [facility] of right-sided abdominal pain.  She had had right upper quadrant ultrasound as an outpatient which had shown findings concerning for a gallbladder mass.  CT of the abdomen pelvis was performed which showed no abnormality in the abdomen and pelvis though it showed a questionable hyperdense area of the right liver dome and several pulmonary nodules scattered in the right base in particular with a large cavitary 2.6 cm nodule in the posterior medial right lower lobe.  CT angiogram was performed which also showed numerous pulmonary nodules predominantly at the bases with some subpleural ones as well with several cavitary nodules.  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  Cultures have been taken on admission the patient was initially on antibiotics in the form of vancomycin and ceftriaxone which is subsequent been stopped.  Labs have been sent including LDH which was normal pneumococcal antigen urine which was negative sed rate which was elevated at 60.  Anti-GBM antibody which was negative ANCA titers that are pending Fungitell, and QuantiFERON gold.  Transthoracic echocardiogram  was performed which did not show evidence of endocarditis.  My understanding is the patient is going to undergo a transesophageal echocardiogram.  It is not clear to me if she is undergoing bronchoscopy during this admission or whether or not pulmonary would like to have a PET scan done as an outpatient seem based on Dr. Oswald Hillock note that the latter was the approach he wanted to  take.  Certainly if she undergoes bronchoscopy would send tissue and BAL for AFB cultures fungal cultures as well as routine bacterial cultures and try to look for nontuberculous mycobacteria fungi and nocardia actinomyces and other subacute pathogens.  She is not ever been around anyone with known tuberculosis in her life she has grown up in the [address] having been in born in [place] and grown up largely there and lived in [place] she has traveled to [place] but is never traveled [place] worried in [place].  I spent  115  minutes with the patient including than 50% of the time in face to face counseling of the patient guarding the differential for her pulmonary nodules personally reviewing T abdomen pelvis CT chest MRI of the spine MRI of the hip abdomen with updated cultures serologies CBC CMP along with review of medical records in preparation for the visit and during the visit and in coordination of her care.     Review of Systems: Review of Systems  Constitutional:  Positive for malaise/fatigue. Negative for chills, diaphoresis, fever and weight loss.  HENT:  Negative for congestion, hearing loss, sore throat and tinnitus.   Eyes:  Negative for blurred vision and double vision.  Respiratory:  Negative for cough, sputum production, shortness of breath and wheezing.   Cardiovascular:  Negative for chest pain, palpitations and leg swelling.  Gastrointestinal:  Positive for abdominal pain, nausea and vomiting. Negative for blood in stool, constipation, diarrhea, heartburn and melena.  Genitourinary:  Negative for dysuria, flank pain and hematuria.  Musculoskeletal:  Negative for back pain, falls, joint pain and myalgias.  Skin:  Negative for itching and rash.  Neurological:  Negative for dizziness, sensory change, focal weakness, loss of consciousness, weakness and headaches.  Endo/Heme/Allergies:  Does not bruise/bleed easily.  Psychiatric/Behavioral:  Negative for  depression, memory loss and suicidal ideas. The patient is not nervous/anxious.    Past Medical History:  Diagnosis Date   CAD (coronary artery disease)    Cardiac tamponade    Cardiac/pericardial tamponade    Cerebral aneurysm    Diabetes mellitus without complication (HCC)    Foot ulcer (HCC)    Insulin pump in place    Osteomyelitis of ankle or foot, acute, left (HCC)    RA (rheumatoid arthritis) (HCC)    Unstable angina (HCC)     Social History   Tobacco Use   Smoking status: Every Day    Packs/day: 0.50    Types: Cigarettes   Smokeless tobacco: Never  Vaping Use   Vaping Use: Never used  Substance Use Topics   Alcohol use: Not Currently   Drug use: Not Currently    History reviewed. No pertinent family history. Allergies  Allergen Reactions   Ace Inhibitors     Other reaction(s): Other (See Comments) Cough pt states not allergic    OBJECTIVE: Blood pressure 137/63, pulse 66, temperature 98.5 F (36.9 C), temperature source Oral, resp. rate 14, height 5\' 8"  (1.727 m), weight 59.7 kg, SpO2 96 %.  Physical Exam Constitutional:      General: She is  not in acute distress.    Appearance: Normal appearance. She is well-developed. She is not ill-appearing or diaphoretic.  HENT:     Head: Normocephalic and atraumatic.     Right Ear: Hearing and external ear normal.     Left Ear: Hearing and external ear normal.     Nose: No nasal deformity or rhinorrhea.     Mouth/Throat:     Comments: She is edentulous her lower gum has implants in place for her dentures Eyes:     General: No scleral icterus.    Conjunctiva/sclera: Conjunctivae normal.     Right eye: Right conjunctiva is not injected.     Left eye: Left conjunctiva is not injected.     Pupils: Pupils are equal, round, and reactive to light.  Neck:     Vascular: No JVD.  Cardiovascular:     Rate and Rhythm: Normal rate and regular rhythm.     Heart sounds: S1 normal and S2 normal.  Pulmonary:     Effort:  Pulmonary effort is normal. No respiratory distress.     Breath sounds: No wheezing.  Abdominal:     General: There is no distension.     Palpations: Abdomen is soft.  Musculoskeletal:        General: Normal range of motion.     Right shoulder: Swelling and deformity present.     Left shoulder: Normal.     Cervical back: Normal range of motion and neck supple.     Right hip: Normal.     Left hip: Normal.     Right knee: Normal.     Left knee: Normal.     Comments: Right-sided bursal prominence suspect related to RA  Lymphadenopathy:     Head:     Right side of head: No submandibular, preauricular or posterior auricular adenopathy.     Left side of head: No submandibular, preauricular or posterior auricular adenopathy.     Cervical: No cervical adenopathy.     Right cervical: No superficial or deep cervical adenopathy.    Left cervical: No superficial or deep cervical adenopathy.  Skin:    General: Skin is warm and dry.     Coloration: Skin is pale.     Findings: No abrasion, bruising, ecchymosis, erythema, lesion or rash.     Nails: There is no clubbing.  Neurological:     General: No focal deficit present.     Mental Status: She is alert and oriented to person, place, and time.     Sensory: No sensory deficit.     Coordination: Coordination normal.     Gait: Gait normal.  Psychiatric:        Attention and Perception: She is attentive.        Mood and Affect: Mood normal.        Speech: Speech normal.        Behavior: Behavior normal. Behavior is cooperative.        Thought Content: Thought content normal.        Judgment: Judgment normal.    Right elbow:      Lower extremity rash December 19, 2021:     Lab Results Lab Results  Component Value Date   WBC 5.6 12/19/2021   HGB 11.1 (L) 12/19/2021   HCT 33.2 (L) 12/19/2021   MCV 94.9 12/19/2021   PLT 112 (L) 12/19/2021    Lab Results  Component Value Date   CREATININE 0.41 (L) 12/19/2021   BUN 12 12/19/2021  NA 139 12/19/2021   K 3.8 12/19/2021   CL 109 12/19/2021   CO2 25 12/19/2021    Lab Results  Component Value Date   ALT 10 12/19/2021   AST 17 12/19/2021   ALKPHOS 65 12/19/2021   BILITOT 0.4 12/19/2021     Microbiology: Recent Results (from the past 240 hour(s))  Culture, blood (routine x 2)     Status: None (Preliminary result)   Collection Time: 12/15/21 10:36 PM   Specimen: BLOOD LEFT FOREARM  Result Value Ref Range Status   Specimen Description   Final    BLOOD LEFT FOREARM Performed at [facility], [address], [place], [place] [address]    Special Requests   Final    BLOOD LEFT FOREARM Performed at [facility], [address], [place], [place] [address]    Culture   Final    NO GROWTH 3 DAYS Performed at [facility], [address], [place], [place] [address]    Report Status PENDING  Incomplete  Culture, blood (routine x 2)     Status: None (Preliminary result)   Collection Time: 12/15/21 10:36 PM   Specimen: BLOOD  Result Value Ref Range Status   Specimen Description   Final    BLOOD Blood Culture adequate volume Performed at [facility], [address] [place], [place] [address]    Special Requests   Final    BLOOD LEFT HAND BOTTLES DRAWN AEROBIC AND ANAEROBIC   Culture   Final    NO GROWTH 3 DAYS Performed at [facility], [address] [place], [place] [address]    Report Status PENDING  Incomplete  Resp Panel by RT-PCR (Flu A&B, Covid) Nasopharyngeal Swab     Status: None   Collection Time: 12/15/21 11:20 PM   Specimen: Nasopharyngeal Swab; Nasopharyngeal(NP) swabs in vial transport medium  Result Value Ref Range Status   SARS Coronavirus 2 by RT PCR NEGATIVE NEGATIVE Final    Comment: (NOTE) SARS-CoV-2 target nucleic acids are NOT DETECTED.  The SARS-CoV-2 RNA is generally detectable in upper respiratory specimens during the acute phase of infection. The lowest concentration  of SARS-CoV-2 viral copies this assay can detect is 138 copies/mL. A negative result does not preclude SARS-Cov-2 infection and should not be used as the sole basis for treatment or other patient management decisions. A negative result may occur with  improper specimen collection/handling, submission of specimen other than nasopharyngeal swab, presence of viral mutation(s) within the areas targeted by this assay, and inadequate number of viral copies(<138 copies/mL). A negative result must be combined with clinical observations, patient history, and epidemiological information. The expected result is Negative.  Fact Sheet for Patients:  BloggerCourse.com  Fact Sheet for Healthcare Providers:  SeriousBroker.it  This test is no t yet approved or cleared by the [place] FDA and  has been authorized for detection and/or diagnosis of SARS-CoV-2 by FDA under an Emergency Use Authorization (EUA). This EUA will remain  in effect (meaning this test can be used) for the duration of the COVID-19 declaration under Section 564(b)(1) of the Act, 21 U.S.C.section 360bbb-3(b)(1), unless the authorization is terminated  or revoked sooner.       Influenza A by PCR NEGATIVE NEGATIVE Final   Influenza B by PCR NEGATIVE NEGATIVE Final    Comment: (NOTE) The Xpert Xpress SARS-CoV-2/FLU/RSV plus assay is intended as an aid in the diagnosis of influenza from Nasopharyngeal swab specimens and should not  be used as a sole basis for treatment. Nasal washings and aspirates are unacceptable for Xpert Xpress SARS-CoV-2/FLU/RSV testing.  Fact Sheet for Patients: BloggerCourse.com  Fact Sheet for Healthcare Providers: SeriousBroker.it  This test is not yet approved or cleared by the [place] FDA and has been authorized for detection and/or diagnosis of SARS-CoV-2 by FDA under an Emergency Use  Authorization (EUA). This EUA will remain in effect (meaning this test can be used) for the duration of the COVID-19 declaration under Section 564(b)(1) of the Act, 21 U.S.C. section 360bbb-3(b)(1), unless the authorization is terminated or revoked.  Performed at [facility], [address] [place], [place] [address]     Acey Lav, MD [facility] [phone] pager  12/19/2021, 2:43 PM

## 2021-12-19 NOTE — Assessment & Plan Note (Addendum)
Gradual downtrend, possibly dilutional ?Some degree of chronicity, mild in 2021 ?Relatively stable, follow outpatient - consider heme as outpatient ?

## 2021-12-19 NOTE — Progress Notes (Signed)
?PROGRESS NOTE ? ? ? ?Nichole Santiago  MRN:[id number] DOB: 03/22/1951 DOA: 12/15/2021 ?PCP: Badger, Michael C, MD  ?Chief Complaint  ?Patient presents with  ? Abdominal Pain  ? ? ?Brief Narrative:  ?Nichole Santiago is Nichole Santiago 71 y.o. female with medical history significant for type 1 diabetes mellitus on insulin pump, hypertension, hyperlipidemia, rheumatoid arthritis on chronic immunosuppressive therapy, who is admitted to [facility] on 12/15/2021 by way of transfer from [facility] emergency department for further evaluation management of erosive changes noted at T11/T12 after presenting from home to the latter facility for further evaluation of potential gallbladder mass identified on outpatient ultrasound.  Imaging has showed multiple pulmonary nodules of unclear significance. ? ?See below for additional details ?   ? ? ?Assessment & Plan: ?  ?Principal Problem: ?  Liver lesion, right lobe ?Active Problems: ?  Multiple pulmonary nodules ?  Abdominal pain ?  Thrombocytopenia (HCC) ?  Dehydration ?  Diabetes mellitus without complication (HCC) ?  HLD (hyperlipidemia) ?  HTN (hypertension) ?  RA (rheumatoid arthritis) (HCC) ? ? ?Assessment and Plan: ?* Liver lesion, right lobe ?No suspicious hepatic lesion ?Suspect RUQ pain related to cavitary nodule in RLL ? ?Multiple pulmonary nodules ?CT abd/pelvis yesterday showed several pulmonary nodules scattered throughout R lung base ?CT showed numerous pulmonary nodules with basilar and subpleural predilection, many cavitary in nature.   ?CT chest was concerning for discitis/osteomyelitis at T11-12, but an MRI of the thoracic spine ruled that out (MR showed advanced degenerative disc disease with 2 large disc extrusions at T11-12 resulting in moderate spinal canal stenosis and cord deformity without abnormal cord signal.  Moderate bilateral neuroforaminal stenosis at T10-11 and T11-12). ?Inflammatory markers not that impressive, sed rate 60.  CRP wnl.  Procalcitonin is  negative.  ?Blood cultures NG ?Echo with normal EF ?Holding antibiotics for now, will follow ?Awaiting TEE ?ID c/s, appreciate recs -> follow TEE, follow urine histo ag, blastomyces ag, cryptococcal ag, HIV, hep B, hep c testing.  If bronch, need AFB and fungal cx ?Differential is broad for pulm nodules -> septic emboli vs mets vs due to rheumatoid vs etc ?Pulmonary has been consulted -> GBM, anca, quant gold, fungitell pending, considering bronch, appreciate assistance ? ? ?Abdominal pain ?RUQ abdominal pain, based on CT, suspect maybe related to the posteriomedial RLL cavitary nodule ?Has 1 cm hyperdense liver lesion,MRI without suspicious hepatic lesion ?Pain management issue now, will continue to address and adjust prn ? ?Thrombocytopenia (HCC) ?Gradual downtrend, possibly dilutional ?Some degree of chronicity, mild in 2021 ?Will follow ? ?Diabetes mellitus without complication (HCC) ?Will start basal/bolus insulin with SSI ?Continue to monitor ?Typically uses insulin pump, but it wasn't on her this morning -> "would not restart pump until pt sees endo after d/c" per 4/7 diabetes coordinator note - last saw endo 2 years ago ? ? ? ? ?Dehydration ?Will continue to monitor with IVF ? ? ? ? ? ?HLD (hyperlipidemia) ?crestor ? ? ?HTN (hypertension) ?Metoprolol ?Losartan on hold ? ? ?RA (rheumatoid arthritis) (HCC) ?Continue arava ? ? ? ?DVT prophylaxis: SCD ?Code Status: full ?Family Communication: daughter brittany 4/9 and 4/10 by phone ?Disposition:  ? ?Status is: Inpatient ?Remains inpatient appropriate because: continued w/u pulmonary nodules ?  ?Consultants:  ?pulm ? ?Procedures:  ?none ? ?Antimicrobials:  ?Anti-infectives (From admission, onward)  ? ? Start     Dose/Rate Route Frequency Ordered Stop  ? 12/16/21 2200  cefTRIAXone (ROCEPHIN) 2 g in sodium chloride 0.9 % 100   mL IVPB  Status:  Discontinued       ? 2 g ?200 mL/hr over 30 Minutes Intravenous Every 24 hours 12/16/21 0510 12/16/21 0949  ? 12/16/21 2200   vancomycin (VANCOREADY) IVPB 1250 mg/250 mL  Status:  Discontinued       ? 1,250 mg ?166.7 mL/hr over 90 Minutes Intravenous Every 24 hours 12/16/21 0519 12/16/21 0817  ? 12/16/21 2200  vancomycin (VANCOCIN) IVPB 1000 mg/200 mL premix  Status:  Discontinued       ? 1,000 mg ?200 mL/hr over 60 Minutes Intravenous Every 24 hours 12/16/21 0817 12/16/21 0949  ? 12/15/21 2230  cefTRIAXone (ROCEPHIN) 2 g in sodium chloride 0.9 % 100 mL IVPB       ? 2 g ?200 mL/hr over 30 Minutes Intravenous  Once 12/15/21 2218 12/15/21 2345  ? 12/15/21 2230  vancomycin (VANCOCIN) IVPB 1000 mg/200 mL premix       ? 1,000 mg ?200 mL/hr over 60 Minutes Intravenous  Once 12/15/21 2218 12/16/21 0019  ? ?  ? ? ?Subjective: ?No new complaints ? ?Objective: ?Vitals:  ? 12/19/21 0616 12/19/21 0635 12/19/21 0750 12/19/21 1325  ?BP: (!) 146/71   137/63  ?Pulse: 61   66  ?Resp: 16   14  ?Temp: 98.4 ?F (36.9 ?C)   98.5 ?F (36.9 ?C)  ?TempSrc: Oral   Oral  ?SpO2: 96%  97% 96%  ?Weight:  59.7 kg    ?Height:      ? ? ?Intake/Output Summary (Last 24 hours) at 12/19/2021 1841 ?Last data filed at 12/19/2021 1700 ?Gross per 24 hour  ?Intake 120 ml  ?Output 1350 ml  ?Net -1230 ml  ? ?Filed Weights  ? 12/17/21 0428 12/18/21 0431 12/19/21 0635  ?Weight: 60.7 kg 59.8 kg 59.7 kg  ? ? ?Examination: ? ?General: No acute distress. ?Cardiovascular: RRR ?Lungs: unlabored ?Abdomen: Soft, nontender, nondistended  ?Neurological: Alert and oriented ?3. Moves all extremities ?4 w. Cranial nerves II through XII grossly intact. ?Skin: Warm and dry. No rashes or lesions. ?Extremities: No clubbing or cyanosis. No edema. ? ?Data Reviewed: I have personally reviewed following labs and imaging studies ? ?CBC: ?Recent Labs  ?Lab 12/15/21 ?1656 12/16/21 ?0235 12/17/21 ?7169 12/18/21 ?6789 12/19/21 ?0818  ?WBC 5.5 5.7 5.4 5.7 5.6  ?NEUTROABS  --  3.1 3.1 3.3 3.3  ?HGB 13.0 11.5* 10.2* 10.6* 11.1*  ?HCT 39.3 35.0* 31.6* 32.2* 33.2*  ?MCV 92.9 95.4 96.3 95.5 94.9  ?PLT 141* 122* 117*  107* 112*  ? ? ?Basic Metabolic Panel: ?Recent Labs  ?Lab 12/15/21 ?1656 12/16/21 ?0235 12/17/21 ?3810 12/18/21 ?1751 12/19/21 ?0818  ?NA 139 140 139 140 139  ?K 3.8 4.0 3.9 3.7 3.8  ?CL 104 107 106 105 109  ?CO2 24 26 27 29 25   ?GLUCOSE 152* 169* 163* 143* 175*  ?BUN 13 11 11 10 12   ?CREATININE 0.49 0.50 0.60 0.47 0.41*  ?CALCIUM 9.6 8.7* 8.7* 8.5* 8.3*  ?MG  --  1.8 1.9 1.9  --   ?PHOS  --   --  3.1 2.5  --   ? ? ?GFR: ?Estimated Creatinine Clearance: 61.7 mL/min (Fayetta Sorenson) (by C-G formula based on SCr of 0.41 mg/dL (L)). ? ?Liver Function Tests: ?Recent Labs  ?Lab 12/15/21 ?1656 12/16/21 ?0235 12/17/21 ?0258 12/18/21 ?5277 12/19/21 ?0818  ?AST 13* 13* 14* 14* 17  ?ALT 9 11 10 10 10   ?ALKPHOS 82 71 65 62 65  ?BILITOT 0.8 0.6 0.3 0.6 0.4  ?PROT 7.2 6.5  5.8* 5.8* 5.8*  ?ALBUMIN 3.8 3.0* 2.7* 2.7* 2.7*  ? ? ?CBG: ?Recent Labs  ?Lab 12/18/21 ?1706 12/18/21 ?2127 12/19/21 ?0726 12/19/21 ?1152 12/19/21 ?1721  ?GLUCAP 113* 119* 170* 115* 120*  ? ? ? ?Recent Results (from the past 240 hour(s))  ?Culture, blood (routine x 2)     Status: None (Preliminary result)  ? Collection Time: 12/15/21 10:36 PM  ? Specimen: BLOOD LEFT FOREARM  ?Result Value Ref Range Status  ? Specimen Description   Final  ?  BLOOD LEFT FOREARM ?Performed at [facility], [address] [place], [place] [address] ?  ? Special Requests   Final  ?  BLOOD LEFT FOREARM ?Performed at [facility], [address], [place], [place] [address] ?  ? Culture   Final  ?  NO GROWTH 3 DAYS ?Performed at [facility], [address] [place], [place] [address] ?  ? Report Status PENDING  Incomplete  ?Culture, blood (routine x 2)     Status: None (Preliminary result)  ? Collection Time: 12/15/21 10:36 PM  ? Specimen: BLOOD  ?Result Value Ref Range Status  ? Specimen Description   Final  ?  BLOOD Blood Culture adequate volume ?Performed at [facility], [address], [place], [place] [address] ?  ? Special Requests    Final  ?  BLOOD LEFT HAND BOTTLES DRAWN AEROBIC AND ANAEROBIC  ? Culture   Final  ?  NO GROWTH 3 DAYS ?Performed at [facility], [address] [place], [place] [address] ?  ? Report Status PENDING  [place]

## 2021-12-19 NOTE — Progress Notes (Signed)
? ? ?  [facility] has been requested to perform a transesophageal echocardiogram on Nichole Santiago for possible culture negative endocarditis. Per chart review, patient has a past medical history of type 1 DM, hypertension, HLD, rheumatoid arthritis on chronic immunosuppressive therapy. Per chart review, patient was admitted to [facility] on 4/6 for evaluation of erosive changes noted at T11/T12, RUQ pain. Patient has a recently identified ultrasound findings of a gallbladder mass. During her admission, she is being treated for multiple pulmonary nodules, abdominal pain, thrombocytopenia. Blood cultures negative. Hosptialist service requested a TEE for evaluation of possible culture negative endocarditis.   ? ?After careful review of history and examination, the risks and benefits of transesophageal echocardiogram have been explained including risks of esophageal damage, perforation (1:10,000 risk), bleeding, pharyngeal hematoma as well as other potential complications associated with conscious sedation including aspiration, arrhythmia, respiratory failure and death. Alternatives to treatment were discussed, questions were answered. Patient is willing to proceed.  ? ?Jonita Albee, PA-C ?12/19/2021 3:41 PM  ? ?

## 2021-12-20 ENCOUNTER — Inpatient Hospital Stay (HOSPITAL_COMMUNITY): Payer: Medicare Other

## 2021-12-20 ENCOUNTER — Inpatient Hospital Stay (HOSPITAL_COMMUNITY): Payer: Medicare Other | Admitting: General Practice

## 2021-12-20 ENCOUNTER — Encounter (HOSPITAL_COMMUNITY)
Admission: EM | Disposition: A | Discharge status: Home / Self Care | Payer: Self-pay | Source: Home / Self Care | Attending: Family Medicine

## 2021-12-20 ENCOUNTER — Encounter (HOSPITAL_COMMUNITY): Payer: Self-pay | Admitting: Internal Medicine

## 2021-12-20 DIAGNOSIS — R7881 Bacteremia: Secondary | ICD-10-CM

## 2021-12-20 DIAGNOSIS — K769 Liver disease, unspecified: Secondary | ICD-10-CM | POA: Diagnosis not present

## 2021-12-20 DIAGNOSIS — I76 Septic arterial embolism: Principal | ICD-10-CM

## 2021-12-20 DIAGNOSIS — I340 Nonrheumatic mitral (valve) insufficiency: Secondary | ICD-10-CM | POA: Diagnosis not present

## 2021-12-20 DIAGNOSIS — I25119 Atherosclerotic heart disease of native coronary artery with unspecified angina pectoris: Secondary | ICD-10-CM

## 2021-12-20 DIAGNOSIS — I700 Atherosclerosis of aorta: Secondary | ICD-10-CM

## 2021-12-20 DIAGNOSIS — I7090 Unspecified atherosclerosis: Secondary | ICD-10-CM

## 2021-12-20 DIAGNOSIS — I358 Other nonrheumatic aortic valve disorders: Secondary | ICD-10-CM

## 2021-12-20 DIAGNOSIS — I10 Essential (primary) hypertension: Secondary | ICD-10-CM

## 2021-12-20 HISTORY — PX: TEE WITHOUT CARDIOVERSION: SHX5443

## 2021-12-20 LAB — ANCA TITERS
Atypical P-ANCA titer: <1:20 {titer}
C-ANCA: <1:20 {titer}
P-ANCA: <1:20 {titer}

## 2021-12-20 LAB — CBC WITH DIFFERENTIAL/PLATELET
Abs Immature Granulocytes: 0.02 10*3/uL (ref 0.00–0.07)
Basophils Absolute: 0.1 10*3/uL (ref 0.0–0.1)
Basophils Relative: 1 %
Eosinophils Absolute: 0.3 10*3/uL (ref 0.0–0.5)
Eosinophils Relative: 5 %
HCT: 31 % — ABNORMAL LOW (ref 36.0–46.0)
Hemoglobin: 10 g/dL — ABNORMAL LOW (ref 12.0–15.0)
Immature Granulocytes: 0 %
Lymphocytes Relative: 34 %
Lymphs Abs: 1.9 10*3/uL (ref 0.7–4.0)
MCH: 31.1 pg (ref 26.0–34.0)
MCHC: 32.3 g/dL (ref 30.0–36.0)
MCV: 96.3 fL (ref 80.0–100.0)
Monocytes Absolute: 0.8 10*3/uL (ref 0.1–1.0)
Monocytes Relative: 15 %
Neutro Abs: 2.5 10*3/uL (ref 1.7–7.7)
Neutrophils Relative %: 45 %
Platelets: 106 10*3/uL — ABNORMAL LOW (ref 150–400)
RBC: 3.22 MIL/uL — ABNORMAL LOW (ref 3.87–5.11)
RDW: 13.8 % (ref 11.5–15.5)
WBC: 5.5 10*3/uL (ref 4.0–10.5)
nRBC: 0 % (ref 0.0–0.2)

## 2021-12-20 LAB — GLUCOSE, CAPILLARY
Glucose-Capillary: 146 mg/dL — ABNORMAL HIGH (ref 70–99)
Glucose-Capillary: 106 mg/dL — ABNORMAL HIGH (ref 70–99)
Glucose-Capillary: 103 mg/dL — ABNORMAL HIGH (ref 70–99)
Glucose-Capillary: 105 mg/dL — ABNORMAL HIGH (ref 70–99)

## 2021-12-20 LAB — COMPREHENSIVE METABOLIC PANEL
ALT: 10 U/L (ref 0–44)
AST: 15 U/L (ref 15–41)
Albumin: 2.6 g/dL — ABNORMAL LOW (ref 3.5–5.0)
Alkaline Phosphatase: 61 U/L (ref 38–126)
Anion gap: 6 (ref 5–15)
BUN: 17 mg/dL (ref 8–23)
CO2: 24 mmol/L (ref 22–32)
Calcium: 8.4 mg/dL — ABNORMAL LOW (ref 8.9–10.3)
Chloride: 110 mmol/L (ref 98–111)
Creatinine, Ser: 0.47 mg/dL (ref 0.44–1.00)
GFR, Estimated: >60 mL/min (ref >60)
Glucose, Bld: 149 mg/dL — ABNORMAL HIGH (ref 70–99)
Potassium: 3.6 mmol/L (ref 3.5–5.1)
Sodium: 140 mmol/L (ref 135–145)
Total Bilirubin: 0.6 mg/dL (ref 0.3–1.2)
Total Protein: 5.6 g/dL — ABNORMAL LOW (ref 6.5–8.1)

## 2021-12-20 LAB — FUNGITELL, SERUM: Fungitell Result: <31 pg/mL (ref <80)

## 2021-12-20 LAB — PHOSPHORUS: Phosphorus: 3.2 mg/dL (ref 2.5–4.6)

## 2021-12-20 LAB — HIV-1 RNA QUANT-NO REFLEX-BLD
HIV 1 RNA Quant: <20 copies/mL
LOG10 HIV-1 RNA: UNDETERMINED log10copy/mL

## 2021-12-20 LAB — HEPATITIS C ANTIBODY: HCV Ab: REACTIVE — AB

## 2021-12-20 LAB — MAGNESIUM: Magnesium: 2 mg/dL (ref 1.7–2.4)

## 2021-12-20 LAB — HEPATITIS B SURFACE ANTIBODY, QUANTITATIVE: Hep B S AB Quant (Post): <3.1 m[IU]/mL — ABNORMAL LOW (ref >9.9)

## 2021-12-20 SURGERY — ECHOCARDIOGRAM, TRANSESOPHAGEAL
Anesthesia: Monitor Anesthesia Care

## 2021-12-20 MED ORDER — KETOROLAC TROMETHAMINE 15 MG/ML IJ SOLN
15.0000 mg | Freq: Once | INTRAMUSCULAR | Status: AC
  Administered 2021-12-20: 15 mg via INTRAVENOUS
  Filled 2021-12-20: qty 1

## 2021-12-20 MED ORDER — PROPOFOL 500 MG/50ML IV EMUL
INTRAVENOUS | Status: DC | PRN
  Administered 2021-12-20: 150 ug/kg/min via INTRAVENOUS

## 2021-12-20 MED ORDER — OXYCODONE HCL 5 MG PO TABS: 5.0000 mg | ORAL_TABLET | Freq: Four times a day (QID) | ORAL | 0 refills | Status: AC | PRN

## 2021-12-20 MED ORDER — LIDOCAINE 2% (20 MG/ML) 5 ML SYRINGE
INTRAMUSCULAR | Status: DC | PRN
  Administered 2021-12-20: 100 mg via INTRAVENOUS

## 2021-12-20 MED ORDER — PROPOFOL 10 MG/ML IV BOLUS
INTRAVENOUS | Status: DC | PRN
  Administered 2021-12-20 (×2): 20 mg via INTRAVENOUS

## 2021-12-20 NOTE — Assessment & Plan Note (Addendum)
Grade iii protruding plaque ?Continue aspirin, statin ?Follow outpatient ?

## 2021-12-20 NOTE — Interval H&P Note (Signed)
History and Physical Interval Note: ? ?12/20/2021 ?11:56 AM ? ?Nichole Santiago  has presented today for surgery, with the diagnosis of BACTEREMIA.  The various methods of treatment have been discussed with the patient and family. After consideration of risks, benefits and other options for treatment, the patient has consented to  Procedure(s): ?TRANSESOPHAGEAL ECHOCARDIOGRAM (TEE) (N/A) as a surgical intervention.  The patient's history has been reviewed, patient examined, no change in status, stable for surgery.  I have reviewed the patient's chart and labs.  Questions were answered to the patient's satisfaction.   ? ? ?Nichole Santiago ? ? ?

## 2021-12-20 NOTE — Transfer of Care (Signed)
Immediate Anesthesia Transfer of Care Note ? ?Patient: [name] ? ?Procedure(s) Performed: TRANSESOPHAGEAL ECHOCARDIOGRAM (TEE) ? ?Patient Location: Endoscopy Unit ? ?Anesthesia Type:MAC ? ?Level of Consciousness: awake and drowsy ? ?Airway & Oxygen Therapy: Patient Spontanous Breathing and Patient connected to nasal cannula oxygen ? ?Post-op Assessment: Report given to RN and Post -op Vital signs reviewed and stable ? ?Post vital signs: Reviewed and stable ? ?Last Vitals:  ?Vitals Value Taken Time  ?BP 105/42 12/20/21 1316  ?Temp    ?Pulse 58 12/20/21 1317  ?Resp 24 12/20/21 1317  ?SpO2 97 % 12/20/21 1317  ?Vitals shown include unvalidated device data. ? ?Last Pain:  ?Vitals:  ? 12/20/21 1225  ?TempSrc: Temporal  ?PainSc: 7   ?   ? ?Patients Stated Pain Goal: 0 (12/16/21 2226) ? ?Complications: No notable events documented. ?

## 2021-12-20 NOTE — Op Note (Signed)
INDICATIONS: ?bacteremia ? ?PROCEDURE:  ? ?Informed consent was obtained prior to the procedure. The risks, benefits and alternatives for the procedure were discussed and the patient comprehended these risks.  Risks include, but are not limited to, cough, sore throat, vomiting, nausea, somnolence, esophageal and stomach trauma or perforation, bleeding, low blood pressure, aspiration, pneumonia, infection, trauma to the teeth and death.   ? ?After a procedural time-out, the oropharynx was anesthetized with 20% benzocaine spray.  ? ?During this procedure the patient was administered IV propofol by Anesthesiology, Dr. Renold DonGermeroth ? ?The transesophageal probe was inserted in the esophagus and stomach without difficulty and multiple views were obtained.  The patient was kept under observation until the patient left the procedure room.  The patient left the procedure room in stable condition.  ? ?Agitated microbubble saline contrast was not administered. ? ?COMPLICATIONS:   ? ?There were no immediate complications. ? ?FINDINGS:  ?No evidence of endocarditis. ?Moderate protruding atherosclerotic plaque in the descending thoracic aorta. ?Aortic valve has an unusually long Lambl's excrescence, does not look like a vegetation ? ?RECOMMENDATIONS:   ? ? Identify/treat other source of bacteremia ? ?Time Spent Directly with the Patient: ? ?30 minutes  ? ?Nichole Santiago ?12/20/2021, 1:20 PM ? ?

## 2021-12-20 NOTE — Progress Notes (Signed)
?  Echocardiogram ?Echocardiogram Transesophageal has been performed. ? ?[name] ?12/20/2021, 1:21 PM ?

## 2021-12-20 NOTE — Discharge Summary (Signed)
Physician Discharge Summary  ?Nichole Santiago GUY:[id number] DOB: Oct 21, 1950 DOA: 12/15/2021 ? ?PCP: Chesley Noon, MD ? ?Admit date: 12/15/2021 ?Discharge date: 12/20/2021 ? ?Time spent: 40 minutes ? ?Recommendations for Outpatient Follow-up:  ?Follow outpatient CBC/CMP  ?Follow with pulmonology outpatient for bronchscopy, consideration of pet ?Follow pending labs (histo, blasto, quant gold, hcv rna, anca, gbm ab ?Thrombocytopenia, follow outpatient -> consider heme follow up ?Follow with rheum outpatient ?Hasn't seen endo in [place] while, still on pump -> needs to establish with endo outpatient ? ?Discharge Diagnoses:  ?Principal Problem: ?  Liver lesion, right lobe ?Active Problems: ?  Multiple pulmonary nodules ?  Abdominal pain ?  Thrombocytopenia ([place]) ?  Dehydration ?  Diabetes mellitus without complication ([place]) ?  HLD (hyperlipidemia) ?  HTN (hypertension) ?  RA (rheumatoid arthritis) ([place]) ?  Atherosclerosis ?  Septic embolism ([place]) ? ? ?Discharge Condition: stable ? ?Diet recommendation: heart healthy, diabetic ? ?Filed Weights  ? 12/19/21 5638 12/20/21 0508 12/20/21 1225  ?Weight: 59.7 kg 60.7 kg 60.7 kg  ? ? ?History of present illness:  ?Nichole Santiago is Nichole Santiago 71 y.o. female with medical history significant for type 1 diabetes mellitus on insulin pump, hypertension, hyperlipidemia, rheumatoid arthritis on chronic immunosuppressive therapy, who is admitted to [facility] on 12/15/2021 by way of transfer from [facility] emergency department for further evaluation management of erosive changes noted at T11/T12 after presenting from home to the latter facility for further evaluation of potential gallbladder mass identified on outpatient ultrasound.  Imaging has showed multiple pulmonary nodules of unclear significance. ? ?Workup to this point has been unrevealing.  Blood cultures are no growth x4, echo and TEE showed no evidence of vegetations.  Negative anca titers and GBM antibodies.  Pending quant gold.  She  was seen by pulmonology and ID.   Pulm recommending outpatient bronchoscopy.  Currently with pending ID studies (hep C RNA quant, histoplasma antigen, blastomyces antigen).  Offered inpatient bronch by pulm, but she prefers to wait until outpatient.  Will plan to follow outpatient with pulm. ? ?See below for additional details ?  ? ?Hospital Course:  ?Assessment and Plan: ?* Liver lesion, right lobe ?No suspicious hepatic lesion ?Suspect RUQ pain related to cavitary nodule in RLL ? ?Multiple pulmonary nodules ?Differential is broad for pulm nodules -> infectious vs inflammatory vs mets, etc ?CT abd/pelvis yesterday showed several pulmonary nodules scattered throughout R lung base ?CT showed numerous pulmonary nodules with basilar and subpleural predilection, many cavitary in nature.   ?CT chest was concerning for discitis/osteomyelitis at T11-12, but an MRI of the thoracic spine ruled that out (MR showed advanced degenerative disc disease with 2 large disc extrusions at T11-12 resulting in moderate spinal canal stenosis and cord deformity without abnormal cord signal.  Moderate bilateral neuroforaminal stenosis at T10-11 and T11-12). ?Inflammatory markers not that impressive, sed rate 60.  CRP wnl.  Procalcitonin is negative.  ?Blood cultures NG x4 ?Echo with normal EF ?Holding antibiotics for now, will follow ?Awaiting TEE -> no evidence of vegetation/infective endocarditis ?ID c/s, appreciate recs -> follow TEE (no vegetation), follow urine histo ag, blastomyces ag, cryptococcal ag negative, HIV negative, hep B surface ag non reactive, hep b surface ab <3.1, hep c reactive - pending hep C RNA.  If bronch, need AFB and fungal cx  ?Pulmonary has been consulted -> GBM negative (ordered twice, 1 pending), anca (negative ordered twice, 1 pending), quant gold pending, fungitell negative, planning for outpatient bronch ? ? ?Abdominal  pain ?RUQ abdominal pain, based on CT, suspect maybe related to the posteriomedial RLL  cavitary nodule ?Has 1 cm hyperdense liver lesion,MRI without suspicious hepatic lesion ?Follow outpatient, discharge with oxycodone.  Try apap, ibuprofen prn. ? ?Thrombocytopenia ([place]) ?Gradual downtrend, possibly dilutional ?Some degree of chronicity, mild in 2021 ?Relatively stable, follow outpatient - consider heme as outpatient ? ?Diabetes mellitus without complication ([place]) ?Will start basal/bolus insulin with SSI ?Continue to monitor ?Typically uses insulin pump, but it wasn't on her this morning -> "would not restart pump until pt sees endo after d/c" per 4/7 diabetes coordinator note - last saw endo 2 years ago ? ?Discussed with daughter brittany who manages her mother's insulin.  She's more comfortable continuing pump, recommended follow up and discussion with PCP and establishing with endocrine.  Continue pump at discharge ? ? ? ? ?Dehydration ?improved ? ? ? ? ?HLD (hyperlipidemia) ?crestor ? ? ?HTN (hypertension) ?Metoprolol ?Losartan on hold ? ? ?RA (rheumatoid arthritis) ([place]) ?Continue arava ? ? ?Atherosclerosis ?Grade iii protruding plaque ?Continue aspirin, statin ? ? ?Procedures: ? TEE ?1. Left ventricular ejection fraction, by estimation, is 60 to 65%. The  ?left ventricle has normal function. The left ventricle has no regional  ?wall motion abnormalities. There is mild concentric left ventricular  ?hypertrophy.  ? 2. Right ventricular systolic function is normal. The right ventricular  ?size is normal.  ? 3. No left atrial/left atrial appendage thrombus was detected.  ? 4. The mitral valve is normal in structure. Mild mitral valve  ?regurgitation. No evidence of mitral stenosis.  ? 5. The aortic valve is tricuspid. Aortic valve regurgitation is trivial.  ?No aortic stenosis is present.  ? 6. There is Moderate (Grade III) protruding plaque involving the  ?descending aorta.  ? 7. The inferior vena cava is normal in size with greater than 50%  ?respiratory variability, suggesting right atrial  pressure of 3 mmHg.  ? ?Conclusion(s)/Recommendation(s): No evidence of vegetation/infective  ?endocarditis on this transesophageael echocardiogram.  ? ?Echo ?IMPRESSIONS  ? ? ? 1. Left ventricular ejection fraction, by estimation, is 65 to 70%. The  ?left ventricle has normal function. The left ventricle has no regional  ?wall motion abnormalities. There is moderate concentric left ventricular  ?hypertrophy. Left ventricular  ?diastolic parameters were normal.  ? 2. Right ventricular systolic function is normal. The right ventricular  ?size is normal. There is normal pulmonary artery systolic pressure.  ? 3. The mitral valve is normal in structure. Trivial mitral valve  ?regurgitation. No evidence of mitral stenosis.  ? 4. The aortic valve is tricuspid. Aortic valve regurgitation is not  ?visualized. No aortic stenosis is present.  ? 5. The inferior vena cava is normal in size with greater than 50%  ?respiratory variability, suggesting right atrial pressure of 3 mmHg.  ? ?Comparison(s): No prior Echocardiogram.  ? ?Conclusion(s)/Recommendation(s): Normal biventricular function without  ?evidence of hemodynamically significant valvular heart disease ?Consultations: ?ID ?Pulm ?Cardiology ? ?Discharge Exam: ?Vitals:  ? 12/20/21 1347 12/20/21 1537  ?BP: (!) 164/65 132/66  ?Pulse: (!) 57 79  ?Resp: (!) 21 17  ?Temp:    ?SpO2: 95% 92%  ? ?No complaints, eager for discharge ?Frustrated her abdominal pain remains, feels like we haven't made progress ? ?General: No acute distress. ?Cardiovascular: RRR ?Lungs: unlabored ?Abdomen: Soft, nontender, nondistended ?Neurological: Alert and oriented ?3. Moves all extremities ?4 . Cranial nerves II through XII grossly intact. ?Skin: Warm and dry. No rashes or lesions. ?Extremities: No clubbing or cyanosis. No  edema. ? ?Discharge Instructions ? ? ?Discharge Instructions   ? ? Call MD for:  difficulty breathing, headache or visual disturbances   Complete by: As directed ?  ? Call MD  for:  extreme fatigue   Complete by: As directed ?  ? Call MD for:  hives   Complete by: As directed ?  ? Call MD for:  persistant dizziness or light-headedness   Complete by: As directed ?  ? Call MD for:

## 2021-12-20 NOTE — Progress Notes (Signed)
? ?NAME:  Nichole Santiago, MRN:  [id number], DOB:  Aug 09, 1951, LOS: 4 ?ADMISSION DATE:  12/15/2021, CONSULTATION DATE:  12/16/21 ?REFERRING MD:  Florene Glen - [facility], CHIEF COMPLAINT:  abnormal CT chest   ? ?History of Present Illness:  ?71 yo F PMH RA on avara, tobacco use, IDDM who presented to [facility] ER 4/6 with abdominal pain, and was admitted to [facility] to [facility] after [place] identified possible gallbladder mass and T11/T12 changes. A CT a/p was performed and this revealed a possible 1cm R liver dome lesion, focal wall thickening of fundal gallbladder, and unexpected RLL pulmonary nodules -- including some cavitary nodules up to 2cm in size. Received vanc and cefepime on 4/6. PCT on 4/7 is < 0.1 ? ?PCCM consulted in this setting  ? ?Pertinent  Medical History  ?Tobacco abuse ?RA ?HTN ?HLD ?IDDM ?Significant Hospital Events: ?Including procedures, antibiotic start and stop dates in addition to other pertinent events   ?4/6 [facility] with RUQ abdominal pain. Abnormal RUQ [place] -- T11 T12 changes, ? Gb lesion. CT a/p revealed ?1cm liver lesion as well as numerous RLL pulm nodules  ?4/7 admit to [facility] at [facility]. Pulm consult  ? ?Interim History / Subjective:  ? ?No acute events overnight.  ?TEE is negative for endocarditis ?Discussed options for bronchoscopy with BAL inpatient vs waiting to see our advanced bronchoscopists in clinic to be considered for BAL and navigational biopsies, she preferred to wait.  ? ?Anti-GBM and ANCA testing is negative. ? ? ?Objective   ?Blood pressure 132/66, pulse 79, temperature 98.4 ?F (36.9 ?C), temperature source Temporal, resp. rate 17, height _0  (1.727 m), weight 60.7 kg, SpO2 92 %. ?   ?   ? ?Intake/Output Summary (Last 24 hours) at 12/20/2021 1626 ?Last data filed at 12/20/2021 1312 ?Gross per 24 hour  ?Intake 340 ml  ?Output 700 ml  ?Net -360 ml  ? ?Filed Weights  ? 12/19/21 7588 12/20/21 0508 12/20/21 1225  ?Weight: 59.7 kg 60.7 kg 60.7 kg  ? ? ?Examination: ?General: Chronically ill woman, no acute  distress ?HENT: NCAT pink mm ?Lungs: clear to auscultation bilaterally, no wheezing ?Cardiovascular: rrr, no murmurs ?Abdomen: soft, non-tender, non-distended, BS+ ?Extremities: warm, no edema ?Neuro: AAO 3 ?GU: defer  ? ?Resolved Hospital Problem list   ? ? ?Assessment & Plan:  ? ?Pulmonary nodules RLL, with some cavitary appearing nodules  ?Tobacco abuse disorder ?Emphysema ?-CT chest personally reviewed ?- Differential includes infection vs inflammatory etiology and less likely malignancy ?- LDH is not elevated. Fungitell is pending along with further workup per ID ?- TEE is negative for endocarditis ?- I do not feel PET scan will give [place] much more information at this time prior to moving forward with a bronchoscopy with BAL and navigational bronchoscopy for biopsies of the nodules ?- Inflammatory workup is unrevelaing at this time, negative ANCA and anti-gbm. ?- smoking cessation advised ?- Continue spiriva, prn nebs ? ?T11/T12 degenerative changes, with 2 large disc extrusions, moderate spinal canal stenosis  ?-MRI ruled out discitis  ? ?RUQ pain ?-1cm R liver dome lesion  ?P ?-PRN dilaudid  ? ?RA on arava ?P ?-cont avara ? ?IDDM ?-SSI ? ?PCCM will sign off.  ? ?Best Practice (right click and "Reselect all SmartList Selections" daily)  ? ?Per Primary ? ?Labs   ?CBC: ?Recent Labs  ?Lab 12/16/21 ?0235 12/17/21 ?0647 12/18/21 ?3254 12/19/21 ?9826 12/20/21 ?0541  ?WBC 5.7 5.4 5.7 5.6 5.5  ?NEUTROABS 3.1 3.1 3.3 3.3 2.5  ?HGB 11.5*  10.2* 10.6* 11.1* 10.0*  ?HCT 35.0* 31.6* 32.2* 33.2* 31.0*  ?MCV 95.4 96.3 95.5 94.9 96.3  ?PLT 122* 117* 107* 112* 106*  ? ? ?Basic Metabolic Panel: ?Recent Labs  ?Lab 12/16/21 ?0235 12/17/21 ?0647 12/18/21 ?8527 12/19/21 ?7824 12/20/21 ?0541  ?NA 140 139 140 139 140  ?K 4.0 3.9 3.7 3.8 3.6  ?CL 107 106 105 109 110  ?CO2 _0 ?GLUCOSE 169* 163* 143* 175* 149*  ?BUN _1 ?CREATININE 0.50 0.60 0.47 0.41* 0.47  ?CALCIUM 8.7* 8.7* 8.5* 8.3* 8.4*  ?MG 1.8 1.9 1.9  --   2.0  ?PHOS  --  3.1 2.5  --  3.2  ? ?GFR: ?Estimated Creatinine Clearance: 62.7 mL/min (by C-G formula based on SCr of 0.47 mg/dL). ?Recent Labs  ?Lab 12/15/21 ?2236 12/16/21 ?0235 12/16/21 ?1308 12/17/21 ?2353 12/18/21 ?6144 12/19/21 ?3154 12/20/21 ?0541  ?PROCALCITON  --   --  <0.10 <0.10 <0.10  --   --   ?WBC  --  5.7  --  5.4 5.7 5.6 5.5  ?LATICACIDVEN 0.9 1.2  --   --   --   --   --   ? ? ?Liver Function Tests: ?Recent Labs  ?Lab 12/16/21 ?0235 12/17/21 ?0647 12/18/21 ?0086 12/19/21 ?7619 12/20/21 ?0541  ?AST 13* 14* 14* 17 15  ?ALT _2 ?ALKPHOS 71 65 62 65 61  ?BILITOT 0.6 0.3 0.6 0.4 0.6  ?PROT 6.5 5.8* 5.8* 5.8* 5.6*  ?ALBUMIN 3.0* 2.7* 2.7* 2.7* 2.6*  ? ?Recent Labs  ?Lab 12/15/21 ?1656  ?LIPASE <10*  ? ?No results for input(s): AMMONIA in the last 168 hours. ? ?ABG ?   ?Component Value Date/Time  ? HCO3 19.4 (L) 10/18/2019 1852  ? ACIDBASEDEF 4.8 (H) 10/18/2019 1852  ? O2SAT 52.3 10/18/2019 1852  ?  ? ?Coagulation Profile: ?Recent Labs  ?Lab 12/16/21 ?5093  ?INR 1.1  ? ? ?Cardiac Enzymes: ?No results for input(s): CKTOTAL, CKMB, CKMBINDEX, TROPONINI in the last 168 hours. ? ?HbA1C: ?Hgb A1c MFr Bld  ?Date/Time Value Ref Range Status  ?12/16/2021 07:10 AM 7.1 (H) 4.8 - 5.6 % Final  ?  Comment:  ?  (NOTE) ?Pre diabetes:          5.7%-6.4% ? ?Diabetes:              >6.4% ? ?Glycemic control for   <7.0% ?adults with diabetes ?  ? ? ?CBG: ?Recent Labs  ?Lab 12/19/21 ?1721 12/19/21 ?2142 12/20/21 ?0755 12/20/21 ?1230 12/20/21 ?1338  ?GLUCAP 120* 215* 146* 106* 103*  ? ?Freda Jackson, MD ?[facility] ?Office: [phone] ? ? ?See Amion for personal pager ?PCCM on call pager [phone] until 7pm. ?Please call Elink 7p-7a. [phone] ? ? ? ? ? ? ?

## 2021-12-20 NOTE — Anesthesia Procedure Notes (Signed)
Procedure Name: [place] ?Date/Time: 12/20/2021 1:05 PM ?Performed by: Dorann Lodge, CRNA ?Pre-anesthesia Checklist: Patient identified, Emergency Drugs available, Suction available and Patient being monitored ?Patient Re-evaluated:Patient Re-evaluated prior to induction ?Oxygen Delivery Method: Nasal cannula ?Induction Type: IV induction ?Airway Equipment and Method: Bite block ? ? ? ? ?

## 2021-12-20 NOTE — Anesthesia Preprocedure Evaluation (Addendum)
Anesthesia Evaluation  ?Patient identified by MRN, date of birth, ID band ?Patient awake ? ? ? ?Reviewed: ?Allergy & Precautions, NPO status , Patient's Chart, lab work & pertinent test results, reviewed documented beta blocker date and time  ? ?Airway ?Mallampati: II ? ?TM Distance: >3 FB ?Neck ROM: Full ? ? ? Dental ? ?(+) Edentulous Upper, Edentulous Lower, Dental Advisory Given ?  ?Pulmonary ?Current Smoker,  ?  ?Pulmonary exam normal ?breath sounds clear to auscultation ? ? ? ? ? ? Cardiovascular ?hypertension, Pt. on home beta blockers and Pt. on medications ?+ angina + CAD  ?Normal cardiovascular exam ?Rhythm:Regular Rate:Normal ? ?Echo 12/2021 ??1. Left ventricular ejection fraction, by estimation, is 65 to 70%. The left ventricle has normal function. The left ventricle has no regional wall motion abnormalities. There is moderate concentric left ventricular hypertrophy. Left ventricular diastolic parameters were normal.  ??2. Right ventricular systolic function is normal. The right ventricular size is normal. There is normal pulmonary artery systolic pressure.  ??3. The mitral valve is normal in structure. Trivial mitral valve regurgitation. No evidence of mitral stenosis.  ??4. The aortic valve is tricuspid. Aortic valve regurgitation is not visualized. No aortic stenosis is present.  ??5. The inferior vena cava is normal in size with greater than 50% respiratory variability, suggesting right atrial pressure of 3 mmHg.  ?  ?Neuro/Psych ?negative neurological ROS ?   ? GI/Hepatic ?negative GI ROS, Neg liver ROS,   ?Endo/Other  ?diabetes ? Renal/GU ?negative Renal ROS  ? ?  ?Musculoskeletal ? ?(+) Arthritis ,  ? Abdominal ?  ?Peds ? Hematology ?negative hematology ROS ?(+)   ?Anesthesia Other Findings ? ? Reproductive/Obstetrics ? ?  ? ? ? ? ? ? ? ? ? ? ? ? ? ?  ?  ? ? ? ? ? ? ? ?Anesthesia Physical ?Anesthesia Plan ? ?ASA: 3 ? ?Anesthesia Plan: MAC  ? ?Post-op Pain  Management: Minimal or no pain anticipated  ? ?Induction: Intravenous ? ?PONV Risk Score and Plan: 1 and Treatment may vary due to age or medical condition, TIVA and Propofol infusion ? ?Airway Management Planned:  ? ?Additional Equipment:  ? ?Intra-op Plan:  ? ?Post-operative Plan:  ? ?Informed Consent: I have reviewed the patients History and Physical, chart, labs and discussed the procedure including the risks, benefits and alternatives for the proposed anesthesia with the patient or authorized representative who has indicated his/her understanding and acceptance.  ? ? ? ?Dental advisory given ? ?Plan Discussed with: CRNA ? ?Anesthesia Plan Comments:   ? ? ? ? ? ?Anesthesia Quick Evaluation ? ?

## 2021-12-20 NOTE — Progress Notes (Signed)
Patient discharged, vital signs taken. IV removed prior to oncoming shift. Patient left via wheelchair, escorted by daughter and staff. Discharge papers reviewed with daughter.Patient tolerated transport well and left vias private vehicle driven by family member. ?

## 2021-12-21 ENCOUNTER — Encounter (HOSPITAL_COMMUNITY): Payer: Self-pay | Admitting: Cardiovascular Disease

## 2021-12-21 LAB — CULTURE, BLOOD (ROUTINE X 2)
Culture: NO GROWTH
Culture: NO GROWTH
Specimen Description: ADEQUATE

## 2021-12-21 LAB — QUANTIFERON-TB GOLD PLUS (RQFGPL)
QuantiFERON Mitogen Value: >10 IU/mL
QuantiFERON Nil Value: 0.03 IU/mL
QuantiFERON TB1 Ag Value: 0.04 IU/mL
QuantiFERON TB2 Ag Value: 0.07 IU/mL

## 2021-12-21 LAB — QUANTIFERON-TB GOLD PLUS: QuantiFERON-TB Gold Plus: NEGATIVE

## 2021-12-21 NOTE — Anesthesia Postprocedure Evaluation (Signed)
Anesthesia Post Note ? ?Patient: Nichole Santiago ? ?Procedure(s) Performed: TRANSESOPHAGEAL ECHOCARDIOGRAM (TEE) ? ?  ? ?Patient location during evaluation: Endoscopy ?Level of consciousness: awake and alert ?Pain management: pain level controlled ?Vital Signs Assessment: post-procedure vital signs reviewed and stable ?Respiratory status: spontaneous breathing ?Cardiovascular status: stable ?Anesthetic complications: no ? ? ?No notable events documented. ? ?Last Vitals:  ?Vitals:  ? 12/20/21 1537 12/20/21 2034  ?BP: 132/66 (!) 156/72  ?Pulse: 79 (!) 57  ?Resp: 17 16  ?Temp:  36.7 ?C  ?SpO2: 92% 98%  ?  ?Last Pain:  ?Vitals:  ? 12/20/21 2034  ?TempSrc: Oral  ?PainSc:   ? ? ?  ?  ?  ?  ?  ?  ? ?Lewie Loron ? ? ? ? ?

## 2021-12-23 LAB — ANCA PROFILE
Anti-MPO Antibodies: <0.2 units (ref 0.0–0.9)
Anti-PR3 Antibodies: <0.2 units (ref 0.0–0.9)
Atypical P-ANCA titer: <1:20 {titer}
C-ANCA: <1:20 {titer}
P-ANCA: <1:20 {titer}

## 2021-12-23 LAB — GLOMERULAR BASEMENT MEMBRANE ANTIBODIES: GBM Ab: <0.2 units (ref 0.0–0.9)

## 2021-12-23 NOTE — Telephone Encounter (Signed)
Called and spoke with pt's daughter [place] letting her knwo the info per MR and stated to her that after we have the date of the PET, we will get pt scheduled for appt with either RB or BI. [place] verbalized understanding. Order placed for the PET and went ahead and made this be the PET/ Super D protocol since bronch has been discussed and that way a disc will already be made. Nothing further needed. ?

## 2021-12-27 LAB — BLASTOMYCES ANTIGEN

## 2022-01-20 ENCOUNTER — Other Ambulatory Visit (HOSPITAL_COMMUNITY): Payer: Medicare Other

## 2022-01-20 ENCOUNTER — Ambulatory Visit (HOSPITAL_COMMUNITY)
Admission: RE | Admit: 2022-01-20 | Discharge: 2022-01-20 | Disposition: A | Discharge status: Home / Self Care | Payer: Medicare Other | Source: Ambulatory Visit | Attending: Internal Medicine | Admitting: Internal Medicine

## 2022-01-20 DIAGNOSIS — R918 Other nonspecific abnormal finding of lung field: Secondary | ICD-10-CM

## 2022-01-20 LAB — GLUCOSE, CAPILLARY: Glucose-Capillary: 213 mg/dL — ABNORMAL HIGH (ref 70–99)

## 2022-01-20 MED ORDER — FLUDEOXYGLUCOSE F - 18 (FDG) INJECTION
6.8000 | Freq: Once | INTRAVENOUS | Status: AC
Start: 2022-01-20 — End: 2022-01-20
  Administered 2022-01-20: 6.4 via INTRAVENOUS

## 2022-01-25 ENCOUNTER — Encounter: Payer: Self-pay | Admitting: Emergency Medicine

## 2022-01-25 ENCOUNTER — Ambulatory Visit: Payer: Medicare Other | Admitting: Emergency Medicine

## 2022-01-25 DIAGNOSIS — R918 Other nonspecific abnormal finding of lung field: Secondary | ICD-10-CM | POA: Diagnosis not present

## 2022-01-25 NOTE — Progress Notes (Signed)
? ?Subjective:  ? ? Patient ID: Nichole Santiago, female    DOB: 14-Apr-1951, 71 y.o.   MRN: [id number] ? ?HPI ?71 year old former smoker (25 pack years) with a history of CAD, diabetes on insulin, rheumatoid arthritis on Arava, osteoarthritis.  ?She was admitted 12/15/2021 with abdominal discomfort and found to have questionable gallbladder mass lesion on right upper quadrant ultrasound.  A CT abdomen pelvis subsequently revealed questionable 1 cm right liver dome lesion as well as right lower lobe pulmonary nodules.  Some of these were cavitary and up to 2 cm in size.  She underwent a TEE that was negative for endocarditis, was treated with broad-spectrum antibiotics in the event that these were infectious, embolic.  Anti-GBM, ANCA were both negative. ?She denies any sputum, does have some intermittent cough. She does have RUQ pain. She still has joint pain, esp her hands. She sees Dr Ang in Rheum at [place] - considering addition of Humira.  ? ?CT-PA/6/23 reviewed by me, shows numerous bibasilar subpleural pulmonary nodules many of which are cavitary, most consistent with inflammatory process but consider possible malignancy.   ? ?Super D CT chest 01/20/2022 reviewed by me shows no mediastinal or hilar adenopathy, centrilobular and paraseptal emphysema, scattered subpleural reticulation and airway thickening, numerous pulmonary nodules in both lungs many which are cavitary, not significantly changed in distribution or size compared with 1 month prior ? ?PET scan 01/20/2022 reviewed by me shows no evidence of hypermetabolic mediastinal or hilar adenopathy.  Only low-level hypermetabolic activity associated with the bilateral cavitary pulmonary nodules.  The 2.1 x 1.5 cm right lower lobe pulmonary nodule has an SUV max of 3.5. ? ? ?Review of Systems ?As per HPI ? ?Past Medical History:  ?Diagnosis Date  ? CAD (coronary artery disease)   ? Cardiac tamponade   ? Cardiac/pericardial tamponade   ? Cerebral aneurysm   ? Diabetes  mellitus without complication ([place])   ? Foot ulcer ([place])   ? Insulin pump in place   ? Osteomyelitis of ankle or foot, acute, left ([place])   ? RA (rheumatoid arthritis) ([place])   ? Unstable angina (HCC)   ?  ? ?No family history on file.  ? ?Social History  ? ?Socioeconomic History  ? Marital status: Widowed  ?  Spouse name: Not on file  ? Number of children: Not on file  ? Years of education: Not on file  ? Highest education level: Not on file  ?Occupational History  ? Not on file  ?Tobacco Use  ? Smoking status: Former  ?  Packs/day: 0.50  ?  Years: 50.00  ?  Pack years: 25.00  ?  Types: Cigarettes  ? Smokeless tobacco: Never  ? Tobacco comments:  ?  Stopped smoking in April 2023 ARJ 01/25/22  ?Vaping Use  ? Vaping Use: Never used  ?Substance and Sexual Activity  ? Alcohol use: Not Currently  ? Drug use: Not Currently  ? Sexual activity: Not on file  ?Other Topics Concern  ? Not on file  ?Social History Narrative  ? Not on file  ? ?Social Determinants of Health  ? ?Financial Resource Strain: Not on file  ?Food Insecurity: Not on file  ?Transportation Needs: Not on file  ?Physical Activity: Not on file  ?Stress: Not on file  ?Social Connections: Not on file  ?Intimate Partner Violence: Not on file  ?  ? ?Allergies  ?Allergen Reactions  ? Ace Inhibitors   ?  Other reaction(s): Other (See Comments) ?Cough pt  states not allergic  ?  ? ?Outpatient Medications Prior to Visit  ?Medication Sig Dispense Refill  ? aspirin EC 81 MG tablet Take 81 mg by mouth daily.    ? glucose blood test strip TEST 6 TIMES DAILY    ? insulin lispro (HUMALOG) 100 UNIT/ML injection Use up to 40 units a day in the insulin pump (Patient taking differently: 30-50 Units See admin instructions. Use up to 50 units a day in the insulin pump) 60 mL 3  ? leflunomide (ARAVA) 20 MG tablet Take 20 mg by mouth daily.    ? losartan (COZAAR) 25 MG tablet Take 12.5 tablets by mouth daily.    ? metoprolol succinate (TOPROL-XL) 25 MG 24 hr tablet Take 12.5 mg by  mouth every morning.    ? ondansetron (ZOFRAN-ODT) 4 MG disintegrating tablet Take 4 mg by mouth every 8 (eight) hours as needed for nausea or vomiting.    ? ONETOUCH DELICA LANCETS 44R MISC by Does not apply route.    ? pantoprazole (PROTONIX) 40 MG tablet Take 40 mg by mouth daily.    ? pregabalin (LYRICA) 75 MG capsule Take 75 mg by mouth daily.    ? promethazine (PHENERGAN) 25 MG tablet Take 25 mg by mouth every 6 (six) hours as needed for nausea or vomiting.    ? rosuvastatin (CRESTOR) 40 MG tablet Take 40 mg by mouth at bedtime.    ? sertraline (ZOLOFT) 100 MG tablet Take 100 mg by mouth daily.    ? Glucagon, rDNA, (GLUCAGON EMERGENCY) 1 MG KIT Inject under skin 1 mg as needed for hypoglycemia 1 kit 11  ? ?No facility-administered medications prior to visit.  ? ? ? ? ?   ?Objective:  ? Physical Exam ? ?Vitals:  ? 01/25/22 1036  ?BP: 124/72  ?Pulse: 78  ?Temp: 98.2 ?F (36.8 ?C)  ?TempSrc: Oral  ?SpO2: 98%  ?Weight: 118 lb 9.6 oz (53.8 kg)  ?Height: 5' 7"  (1.702 m)  ? ?Gen: Pleasant, thin elderly woman, in no distress,  normal affect, wheelchair ? ?ENT: No lesions,  mouth clear,  oropharynx clear, no postnasal drip ? ?Neck: No JVD, no stridor ? ?Lungs: No use of accessory muscles, no crackles or wheezing on normal respiration, no wheeze on forced expiration ? ?Cardiovascular: RRR, heart sounds normal, no murmur or gallops, no peripheral edema ? ?Musculoskeletal: No deformities, no cyanosis or clubbing ? ?Neuro: alert, awake, non focal ? ?Skin: Warm, no lesions or rash ? ? ?   ?Assessment & Plan:  ? ?Multiple pulmonary nodules ?Bilateral cavitary pulmonary nodules, thick walled.  Her CT chest is reassuring and that the nodules are stable in size.  Also the PET scan is reassuring with no significant hypermetabolism.  Inconsistent with malignancy or active infection.  Based on all of this I suspect that the nodules are related to her RA.  We talked about the pros and cons of bronchoscopy and tissue diagnosis,  culture now versus waiting and watching.  We have decided to repeat her CT in 3 months.  There is been some discussion about starting Humira in addition to the [place] for her RA.  If we need to get culture data, further information about the nodules in order to facilitate the Humira that may make sense to do bronchoscopy sooner.  She is going to see rheumatology next month.  If there is a compelling reason to push for bronchoscopy then we will arrange it.  Otherwise wait to see the repeat CT  in 3 months. ? ? ?Baltazar Apo, MD, PhD ?01/25/2022, 11:21 AM ?[facility] ?[phone] or if no answer before 7:00PM call [phone] ?For any issues after 7:00PM please call eLink [phone] ? ?

## 2022-01-25 NOTE — Addendum Note (Signed)
Addended by: Dorisann Frames R on: 01/25/2022 11:31 AM ? ? Modules accepted: Orders ? ?

## 2022-01-25 NOTE — Assessment & Plan Note (Signed)
Bilateral cavitary pulmonary nodules, thick walled.  Her CT chest is reassuring and that the nodules are stable in size.  Also the PET scan is reassuring with no significant hypermetabolism.  Inconsistent with malignancy or active infection.  Based on all of this I suspect that the nodules are related to her RA.  We talked about the pros and cons of bronchoscopy and tissue diagnosis, culture now versus waiting and watching.  We have decided to repeat her CT in 3 months.  There is been some discussion about starting Humira in addition to the Arava for her RA.  If we need to get culture data, further information about the nodules in order to facilitate the Humira that may make sense to do bronchoscopy sooner.  She is going to see rheumatology next month.  If there is a compelling reason to push for bronchoscopy then we will arrange it.  Otherwise wait to see the repeat CT in 3 months. ?

## 2022-01-25 NOTE — Patient Instructions (Signed)
We will plan to repeat your CT scan of the chest in 3 months (August 2023). ?We discussed the pros and cons of bronchoscopy to evaluate your stable pulmonary nodules.  Discuss also with Dr. Allena NapoleonAng with Rheumatology.  If further evaluation of her nodules would help facilitate your rheumatoid medications then we may need to reconsider performing the bronchoscopy sooner. ?Follow Dr. Delton CoombesByrum in 3 months after CT so we can review the results together. ?

## 2022-04-28 ENCOUNTER — Other Ambulatory Visit (HOSPITAL_BASED_OUTPATIENT_CLINIC_OR_DEPARTMENT_OTHER): Payer: Medicare Other

## 2022-05-03 ENCOUNTER — Ambulatory Visit (HOSPITAL_BASED_OUTPATIENT_CLINIC_OR_DEPARTMENT_OTHER)
Admission: RE | Admit: 2022-05-03 | Discharge: 2022-05-03 | Disposition: A | Discharge status: Home / Self Care | Payer: Medicare Other | Source: Ambulatory Visit | Attending: Emergency Medicine | Admitting: Emergency Medicine

## 2022-05-03 DIAGNOSIS — R918 Other nonspecific abnormal finding of lung field: Secondary | ICD-10-CM | POA: Diagnosis present

## 2022-05-16 ENCOUNTER — Other Ambulatory Visit (HOSPITAL_BASED_OUTPATIENT_CLINIC_OR_DEPARTMENT_OTHER): Payer: Medicare Other

## 2022-05-24 ENCOUNTER — Telehealth: Payer: Self-pay | Admitting: Emergency Medicine

## 2022-05-24 NOTE — Telephone Encounter (Signed)
Patient's daughter is calling to get the results of her mom's scan from 8/23.  She stated she read the results on mychart but needs some clarification.  Please call daughter, Lowanda Foster, to discuss.

## 2022-05-25 NOTE — Telephone Encounter (Signed)
Patient's daughter calling back to go over results. Please advise.

## 2022-05-26 NOTE — Telephone Encounter (Signed)
Nichole Peer, MD  05/16/2022  4:58 PM EDT     Please inform patient that CT chest shows her pulmonary nodules are still present, may be slightly more prominent.  I would like for her to follow-up with me in office to discuss next steps in evaluation.   Called and spoke with both pt and daughter and have scheduled pt an appt with RB. Nothing further needed.

## 2022-06-01 ENCOUNTER — Ambulatory Visit: Payer: Medicare Other | Admitting: Emergency Medicine

## 2022-06-01 ENCOUNTER — Encounter: Payer: Self-pay | Admitting: Emergency Medicine

## 2022-06-01 VITALS — BP 118/68 | HR 84 | Temp 98.5°F | Wt 121.0 lb

## 2022-06-01 DIAGNOSIS — R918 Other nonspecific abnormal finding of lung field: Secondary | ICD-10-CM | POA: Diagnosis not present

## 2022-06-01 DIAGNOSIS — Z01812 Encounter for preprocedural laboratory examination: Secondary | ICD-10-CM

## 2022-06-01 DIAGNOSIS — Z20822 Contact with and (suspected) exposure to covid-19: Secondary | ICD-10-CM

## 2022-06-01 DIAGNOSIS — Z720 Tobacco use: Secondary | ICD-10-CM | POA: Diagnosis not present

## 2022-06-01 DIAGNOSIS — M05711 Rheumatoid arthritis with rheumatoid factor of right shoulder without organ or systems involvement: Secondary | ICD-10-CM

## 2022-06-01 MED ORDER — STIOLTO RESPIMAT 2.5-2.5 MCG/ACT IN AERS: 2.0000 | INHALATION_SPRAY | Freq: Every day | RESPIRATORY_TRACT | 0 refills | Status: AC

## 2022-06-01 NOTE — Addendum Note (Signed)
Addended by: Gavin Potters R on: 06/01/2022 02:49 PM   Modules accepted: Orders

## 2022-06-01 NOTE — Patient Instructions (Signed)
We reviewed your CT scan of the chest today. We will work on arranging navigational bronchoscopy to evaluate pulmonary nodules.  This will be done as an outpatient under general anesthesia at [facility] endoscopy.  You will need a designated driver and someone to watch you after you get home from the procedure.  We will try to get this scheduled for you on 06/05/2022 or 06/12/2022. We will try starting Stiolto 2 puffs once daily see if this helps your breathing.  Keep track of whether you benefit so we can discuss it and possibly send a prescription to your pharmacy You need to stop smoking.  Try to work on cutting down.  We can discuss further at your follow-up. Continue to follow with Dr Ang in Rheumatology Follow with Dr Lamonte Sakai in 1 month or next available

## 2022-06-01 NOTE — Assessment & Plan Note (Signed)
Etiology remains unclear.  The nodules are larger, more numerous, some remain cavitary.  Interestingly they were PET negative 01/2022.  I still suspect that these are rheumatoid nodules but certainly have to consider opportunistic infection, even malignancy.  Discussed the options with her.  We are going to work on arranging navigational bronchoscopy to get culture data, tissue data and better define these nodules.  I will try to get this set up for 9/21 or 10/2

## 2022-06-01 NOTE — Assessment & Plan Note (Signed)
Followed by Dr.Ang in rheumatology.  There is been some discussion of adding Humira to her existing Arava.  It had not been done yet because there is being concerned about the nodules, some risk for opportunistic infection.  If we are able to perform bronchoscopy, rule out infection then it may be possible for her to add this medication.

## 2022-06-01 NOTE — Addendum Note (Signed)
Addended by: Gavin Potters R on: 06/01/2022 03:43 PM   Modules accepted: Orders

## 2022-06-01 NOTE — Progress Notes (Signed)
 Subjective:    Patient ID: Floella A Schweickert, female    DOB: 05/08/1951, 71 y.o.   MRN: [id number]  HPI  ROV 06/01/22 --follow-up visit for 71-year-old woman with a history of active tobacco use, rheumatoid arthritis on immunosuppression, CAD, diabetes.  I saw her in follow-up after hospital admission in April 2023 for cavitary pulmonary nodular disease.  She was treated empirically for possible embolic infection although TEE was negative for endocarditis.  PET scan 01/20/2022 was negative for hypermetabolism in these nodules.  We discussed possible bronchoscopy for culture data and tissue diagnosis, ultimately decided to follow with serial imaging.  Today she reports exertional SOB with walking. SOB w housework. Daily cough - non-productive.   CT chest 05/03/2022 reviewed by me, shows numerous variably cavitary pulmonary nodules bilaterally, some of which have increased in size compared with 01/20/2022   Review of Systems As per HPI  Past Medical History:  Diagnosis Date   CAD (coronary artery disease)    Cardiac tamponade    Cardiac/pericardial tamponade    Cerebral aneurysm    Diabetes mellitus without complication (HCC)    Foot ulcer (HCC)    Insulin pump in place    Osteomyelitis of ankle or foot, acute, left (HCC)    RA (rheumatoid arthritis) (HCC)    Unstable angina (HCC)      No family history on file.   Social History   Socioeconomic History   Marital status: Widowed    Spouse name: Not on file   Number of children: Not on file   Years of education: Not on file   Highest education level: Not on file  Occupational History   Not on file  Tobacco Use   Smoking status: Former    Packs/day: 0.50    Years: 50.00    Total pack years: 25.00    Types: Cigarettes   Smokeless tobacco: Never   Tobacco comments:    Stopped smoking in April 2023 ARJ 01/25/22  Vaping Use   Vaping Use: Never used  Substance and Sexual Activity   Alcohol use: Not Currently   Drug use: Not  Currently   Sexual activity: Not on file  Other Topics Concern   Not on file  Social History Narrative   Not on file   Social Determinants of Health   Financial Resource Strain: Not on file  Food Insecurity: Not on file  Transportation Needs: Not on file  Physical Activity: Not on file  Stress: Not on file  Social Connections: Not on file  Intimate Partner Violence: Not on file     Allergies  Allergen Reactions   Ace Inhibitors     Other reaction(s): Other (See Comments) Cough pt states not allergic     Outpatient Medications Prior to Visit  Medication Sig Dispense Refill   aspirin EC 81 MG tablet Take 81 mg by mouth daily.     glucose blood test strip TEST 6 TIMES DAILY     insulin lispro (HUMALOG) 100 UNIT/ML injection Use up to 40 units a day in the insulin pump (Patient taking differently: 30-50 Units See admin instructions. Use up to 50 units a day in the insulin pump) 60 mL 3   leflunomide (ARAVA) 20 MG tablet Take 20 mg by mouth daily.     losartan (COZAAR) 25 MG tablet Take 12.5 tablets by mouth daily.     metoprolol succinate (TOPROL-XL) 25 MG 24 hr tablet Take 12.5 mg by mouth every morning.       ondansetron (ZOFRAN-ODT) 4 MG disintegrating tablet Take 4 mg by mouth every 8 (eight) hours as needed for nausea or vomiting.     ONETOUCH DELICA LANCETS 33G MISC by Does not apply route.     pantoprazole (PROTONIX) 40 MG tablet Take 40 mg by mouth daily.     pregabalin (LYRICA) 75 MG capsule Take 75 mg by mouth daily.     promethazine (PHENERGAN) 25 MG tablet Take 25 mg by mouth every 6 (six) hours as needed for nausea or vomiting.     rosuvastatin (CRESTOR) 40 MG tablet Take 40 mg by mouth at bedtime.     sertraline (ZOLOFT) 100 MG tablet Take 100 mg by mouth daily.     Glucagon, rDNA, (GLUCAGON EMERGENCY) 1 MG KIT Inject under skin 1 mg as needed for hypoglycemia 1 kit 11   No facility-administered medications prior to visit.         Objective:   Physical  Exam  Vitals:   06/01/22 1412  BP: 118/68  Pulse: 84  Temp: 98.5 F (36.9 C)  TempSrc: Oral  SpO2: 94%  Weight: 121 lb (54.9 kg)   Gen: Pleasant, thin elderly woman, in no distress,  normal affect, wheelchair  ENT: No lesions,  mouth clear,  oropharynx clear, no postnasal drip  Neck: No JVD, no stridor  Lungs: No use of accessory muscles, no crackles or wheezing on normal respiration, no wheeze on forced expiration  Cardiovascular: RRR, heart sounds normal, no murmur or gallops, no peripheral edema  Musculoskeletal: No deformities, no cyanosis or clubbing  Neuro: alert, awake, non focal  Skin: Warm, no lesions or rash      Assessment & Plan:   Tobacco use Continues to smoke.  She has exertional dyspnea.  No PFT available but suspect she does have some obstructive lung disease.  I will do a trial of Stiolto to see if she gets benefit.  Plan to perform PFT at some point going forward.  RA (rheumatoid arthritis) (HCC) Followed by Dr.Ang in rheumatology.  There is been some discussion of adding Humira to her existing Arava.  It had not been done yet because there is being concerned about the nodules, some risk for opportunistic infection.  If we are able to perform bronchoscopy, rule out infection then it may be possible for her to add this medication.  Multiple pulmonary nodules Etiology remains unclear.  The nodules are larger, more numerous, some remain cavitary.  Interestingly they were PET negative 01/2022.  I still suspect that these are rheumatoid nodules but certainly have to consider opportunistic infection, even malignancy.  Discussed the options with her.  We are going to work on arranging navigational bronchoscopy to get culture data, tissue data and better define these nodules.  I will try to get this set up for 9/21 or 10/2   Halim Surrette, MD, PhD 06/01/2022, 2:41 PM [facility] [phone] or if no answer before 7:00PM call  [phone] For any issues after 7:00PM please call eLink [phone]  

## 2022-06-01 NOTE — Assessment & Plan Note (Signed)
Continues to smoke.  She has exertional dyspnea.  No PFT available but suspect she does have some obstructive lung disease.  I will do a trial of Stiolto to see if she gets benefit.  Plan to perform PFT at some point going forward.

## 2022-06-01 NOTE — H&P (View-Only) (Signed)
Subjective:    Patient ID: Nichole Santiago, female    DOB: 07-16-1951, 71 y.o.   MRN: [id number]  HPI  ROV 06/01/22 --follow-up visit for 71 year old woman with a history of active tobacco use, rheumatoid arthritis on immunosuppression, CAD, diabetes.  I saw her in follow-up after hospital admission in April 2023 for cavitary pulmonary nodular disease.  She was treated empirically for possible embolic infection although TEE was negative for endocarditis.  PET scan 01/20/2022 was negative for hypermetabolism in these nodules.  We discussed possible bronchoscopy for culture data and tissue diagnosis, ultimately decided to follow with serial imaging.  Today she reports exertional SOB with walking. SOB w housework. Daily cough - non-productive.   CT chest 05/03/2022 reviewed by me, shows numerous variably cavitary pulmonary nodules bilaterally, some of which have increased in size compared with 01/20/2022   Review of Systems As per HPI  Past Medical History:  Diagnosis Date   CAD (coronary artery disease)    Cardiac tamponade    Cardiac/pericardial tamponade    Cerebral aneurysm    Diabetes mellitus without complication (HCC)    Foot ulcer ([place])    Insulin pump in place    Osteomyelitis of ankle or foot, acute, left (HCC)    RA (rheumatoid arthritis) ([place])    Unstable angina ([place])      No family history on file.   Social History   Socioeconomic History   Marital status: Widowed    Spouse name: Not on file   Number of children: Not on file   Years of education: Not on file   Highest education level: Not on file  Occupational History   Not on file  Tobacco Use   Smoking status: Former    Packs/day: 0.50    Years: 50.00    Total pack years: 25.00    Types: Cigarettes   Smokeless tobacco: Never   Tobacco comments:    Stopped smoking in April 2023 ARJ 01/25/22  Vaping Use   Vaping Use: Never used  Substance and Sexual Activity   Alcohol use: Not Currently   Drug use: Not  Currently   Sexual activity: Not on file  Other Topics Concern   Not on file  Social History Narrative   Not on file   Social Determinants of Health   Financial Resource Strain: Not on file  Food Insecurity: Not on file  Transportation Needs: Not on file  Physical Activity: Not on file  Stress: Not on file  Social Connections: Not on file  Intimate Partner Violence: Not on file     Allergies  Allergen Reactions   Ace Inhibitors     Other reaction(s): Other (See Comments) Cough pt states not allergic     Outpatient Medications Prior to Visit  Medication Sig Dispense Refill   aspirin EC 81 MG tablet Take 81 mg by mouth daily.     glucose blood test strip TEST 6 TIMES DAILY     insulin lispro (HUMALOG) 100 UNIT/ML injection Use up to 40 units a day in the insulin pump (Patient taking differently: 30-50 Units See admin instructions. Use up to 50 units a day in the insulin pump) 60 mL 3   leflunomide (ARAVA) 20 MG tablet Take 20 mg by mouth daily.     losartan (COZAAR) 25 MG tablet Take 12.5 tablets by mouth daily.     metoprolol succinate (TOPROL-XL) 25 MG 24 hr tablet Take 12.5 mg by mouth every morning.  ondansetron (ZOFRAN-ODT) 4 MG disintegrating tablet Take 4 mg by mouth every 8 (eight) hours as needed for nausea or vomiting.     ONETOUCH DELICA LANCETS 82X MISC by Does not apply route.     pantoprazole (PROTONIX) 40 MG tablet Take 40 mg by mouth daily.     pregabalin (LYRICA) 75 MG capsule Take 75 mg by mouth daily.     promethazine (PHENERGAN) 25 MG tablet Take 25 mg by mouth every 6 (six) hours as needed for nausea or vomiting.     rosuvastatin (CRESTOR) 40 MG tablet Take 40 mg by mouth at bedtime.     sertraline (ZOLOFT) 100 MG tablet Take 100 mg by mouth daily.     Glucagon, rDNA, (GLUCAGON EMERGENCY) 1 MG KIT Inject under skin 1 mg as needed for hypoglycemia 1 kit 11   No facility-administered medications prior to visit.         Objective:   Physical  Exam  Vitals:   06/01/22 1412  BP: 118/68  Pulse: 84  Temp: 98.5 F (36.9 C)  TempSrc: Oral  SpO2: 94%  Weight: 121 lb (54.9 kg)   Gen: Pleasant, thin elderly woman, in no distress,  normal affect, wheelchair  ENT: No lesions,  mouth clear,  oropharynx clear, no postnasal drip  Neck: No JVD, no stridor  Lungs: No use of accessory muscles, no crackles or wheezing on normal respiration, no wheeze on forced expiration  Cardiovascular: RRR, heart sounds normal, no murmur or gallops, no peripheral edema  Musculoskeletal: No deformities, no cyanosis or clubbing  Neuro: alert, awake, non focal  Skin: Warm, no lesions or rash      Assessment & Plan:   Tobacco use Continues to smoke.  She has exertional dyspnea.  No PFT available but suspect she does have some obstructive lung disease.  I will do a trial of Stiolto to see if she gets benefit.  Plan to perform PFT at some point going forward.  RA (rheumatoid arthritis) ([place]) Followed by Dr.Ang in rheumatology.  There is been some discussion of adding Humira to her existing Arava.  It had not been done yet because there is being concerned about the nodules, some risk for opportunistic infection.  If we are able to perform bronchoscopy, rule out infection then it may be possible for her to add this medication.  Multiple pulmonary nodules Etiology remains unclear.  The nodules are larger, more numerous, some remain cavitary.  Interestingly they were PET negative 01/2022.  I still suspect that these are rheumatoid nodules but certainly have to consider opportunistic infection, even malignancy.  Discussed the options with her.  We are going to work on arranging navigational bronchoscopy to get culture data, tissue data and better define these nodules.  I will try to get this set up for 9/21 or 10/2   Baltazar Apo, MD, PhD 06/01/2022, 2:41 PM [facility] [phone] or if no answer before 7:00PM call  [phone] For any issues after 7:00PM please call eLink [phone]

## 2022-06-02 ENCOUNTER — Other Ambulatory Visit: Payer: Self-pay

## 2022-06-02 ENCOUNTER — Encounter (HOSPITAL_COMMUNITY): Payer: Self-pay | Admitting: Emergency Medicine

## 2022-06-02 NOTE — Progress Notes (Signed)
Spoke with Ashby Dawes, RN, Diabetes Coordinator and notified her that this patient does have a pump for her diabetes. Pt is not scheduled to be admitted after her procedure. Ashby Dawes states she will follow up as needed.

## 2022-06-02 NOTE — Progress Notes (Signed)
Spoke with pt's daughter, Nichole Santiago (pt in room with her). Pt has hx of CAD with a stent in 2015. No longer sees a cardiologist. Pt is a type 1 diabetic and is on Omnicon pump. I instructed [place] to call pt's doctor that takes care of her diabetes and ask them what pt should do at midnight Sunday about reducing dosage of the Insulin. I did tell that if she cannot reach the physician and get instructions, to reduce the basal rate by 20% at midnight Sunday. She voiced understanding. Pt's most recent A1C was 7.1 on 12/16/21. [place] states pt's fasting blood sugar is usually around 150. Pt wears a Dexcom GG to check her blood sugar. Instructed [place] to have pt check her blood sugar when she wakes up Monday AM and every 2 hours until she leaves for the hospital. If blood sugar is 70 or below, treat with 1/2 cup of clear juice (apple or cranberry) and recheck blood sugar 15 minutes after drinking juice. If blood sugar continues to be 70 or below, call the Short Stay department and ask to speak to a nurse. [place] voiced understanding.   Covid test done yesterday, 06/01/22. Results not in Epic yet.   Shower instructions given to [place] and she voiced understanding.   Sent to PA for review.

## 2022-06-02 NOTE — Progress Notes (Signed)
Anesthesia Chart Review: Same-day work-up  History of CAD s/p DES to LAD.  She presented in October 2015 with unstable angina underwent cardiac catheterization showing 75% stenosis of mid LAD which was successfully stented.  The circumflex and right coronary artery revealed no obstructive disease.  EF was 55%.  She had a relook cath later that month for recurrent chest pain which revealed widely patent stent in the LAD, no angiographic changes.  Echo at that time showed EF 70 to 75% with small pericardial effusion.  She subsequently presented back to the hospital on July 12, 2014 with chest discomfort and was found to have large pericardial effusion with tamponade physiology and underwent pericardial window.  Last follow-up with cardiology was 07/23/2014.  Since that time she has been followed by her PCP Dr. Anastasia Pall.  In April 2023 patient was admitted for evaluation of right upper quadrant pain.  Per discharge summary 12/20/2021, "Workup to this point has been unrevealing.  Blood cultures are no growth x4, echo and TEE showed no evidence of vegetations.  Negative anca titers and GBM antibodies.  Pending quant gold.  She was seen by pulmonology and ID.   Pulm recommending outpatient bronchoscopy.  Currently with pending ID studies (hep C RNA quant, histoplasma antigen, blastomyces antigen).  Offered inpatient bronch by pulm, but she prefers to wait until outpatient.  Will plan to follow outpatient with pulm."  With her arthritis maintained on leflunomide.  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  Type I diabetic maintained on insulin pump.  Last A1c 7.1 on 12/16/2021.  Reviewed history with anesthesiologist Dr. Gifford Shave.  Advised okay to proceed as planned given recent benign echocardiogram findings.  Patient will need day of surgery labs and evaluation.  CT super D chest 04/30/2022: IMPRESSION: 1. Numerous (greater than 20) variably cavitary pulmonary nodules scattered  throughout the lungs bilaterally, most numerous in the posterior lower lobes, several which are mildly increased in size and density since 01/20/2022 chest CT as detailed. While potentially due to waxing rheumatoid nodules, malignancy cannot be excluded by imaging in this high risk patient. 2. No thoracic adenopathy. 3. Three-vessel coronary atherosclerosis. 4. Indistinct heterogeneous hypodense 1.3 cm right liver dome lesion, not appreciably changed, more likely benign. 5. Moderate emphysema with diffuse bronchial wall thickening, suggesting COPD. 6. Aortic Atherosclerosis (ICD10-I70.0) and Emphysema (ICD10-J43.9).  TEE 12/20/2021:  1. Left ventricular ejection fraction, by estimation, is 60 to 65%. The  left ventricle has normal function. The left ventricle has no regional  wall motion abnormalities. There is mild concentric left ventricular  hypertrophy.   2. Right ventricular systolic function is normal. The right ventricular  size is normal.   3. No left atrial/left atrial appendage thrombus was detected.   4. The mitral valve is normal in structure. Mild mitral valve  regurgitation. No evidence of mitral stenosis.   5. The aortic valve is tricuspid. Aortic valve regurgitation is trivial.  No aortic stenosis is present.   6. There is Moderate (Grade III) protruding plaque involving the  descending aorta.   7. The inferior vena cava is normal in size with greater than 50%  respiratory variability, suggesting right atrial pressure of 3 mmHg.   Conclusion(s)/Recommendation(s): No evidence of vegetation/infective  endocarditis on this transesophageael echocardiogram.    TTE 12/16/2021:  1. Left ventricular ejection fraction, by estimation, is 65 to 70%. The  left ventricle has normal function. The left ventricle has no regional  wall motion abnormalities. There is moderate concentric left  ventricular  hypertrophy. Left ventricular  diastolic parameters were normal.   2. Right  ventricular systolic function is normal. The right ventricular  size is normal. There is normal pulmonary artery systolic pressure.   3. The mitral valve is normal in structure. Trivial mitral valve  regurgitation. No evidence of mitral stenosis.   4. The aortic valve is tricuspid. Aortic valve regurgitation is not  visualized. No aortic stenosis is present.   5. The inferior vena cava is normal in size with greater than 50%  respiratory variability, suggesting right atrial pressure of 3 mmHg.   Comparison(s): No prior Echocardiogram.   Conclusion(s)/Recommendation(s): Normal biventricular function without  evidence of hemodynamically significant valvular heart disease.    Wynonia Musty [facility] Short Stay Center/Anesthesiology Phone [phone] 06/02/2022 3:05 PM

## 2022-06-02 NOTE — Anesthesia Preprocedure Evaluation (Signed)
Anesthesia Evaluation  Patient identified by MRN, date of birth, ID band Patient awake    Reviewed: Allergy & Precautions, H&P , NPO status , Patient's Chart, lab work & pertinent test results  Airway Mallampati: II  TM Distance: >3 FB Neck ROM: Full    Dental no notable dental hx. (+) Edentulous Upper, Edentulous Lower, Dental Advisory Given   Pulmonary shortness of breath, Current Smoker and Patient abstained from smoking.,    Pulmonary exam normal breath sounds clear to auscultation       Cardiovascular hypertension, Pt. on medications and Pt. on home beta blockers + CAD and +CHF   Rhythm:Regular Rate:Normal     Neuro/Psych Anxiety Depression negative neurological ROS     GI/Hepatic Neg liver ROS, GERD  ,  Endo/Other  diabetes, Type 1, Insulin Dependent  Renal/GU negative Renal ROS  negative genitourinary   Musculoskeletal  (+) Arthritis , Osteoarthritis,    Abdominal   Peds  Hematology negative hematology ROS (+)   Anesthesia Other Findings   Reproductive/Obstetrics negative OB ROS                            Anesthesia Physical Anesthesia Plan  ASA: 3  Anesthesia Plan: General   Post-op Pain Management: Tylenol PO (pre-op)*   Induction: Intravenous  PONV Risk Score and Plan: 3 and Propofol infusion, TIVA and Ondansetron  Airway Management Planned: Oral ETT  Additional Equipment:   Intra-op Plan:   Post-operative Plan: Extubation in OR  Informed Consent: I have reviewed the patients History and Physical, chart, labs and discussed the procedure including the risks, benefits and alternatives for the proposed anesthesia with the patient or authorized representative who has indicated his/her understanding and acceptance.     Dental advisory given  Plan Discussed with: CRNA  Anesthesia Plan Comments: (PAT note by Karoline Caldwell, PA-C: History of CAD s/p DES to LAD.  She  presented in October 2015 with unstable angina underwent cardiac catheterization showing 75% stenosis of mid LAD which was successfully stented.  The circumflex and right coronary artery revealed no obstructive disease.  EF was 55%.  She had a relook cath later that month for recurrent chest pain which revealed widely patent stent in the LAD, no angiographic changes.  Echo at that time showed EF 70 to 75% with small pericardial effusion.  She subsequently presented back to the hospital on July 12, 2014 with chest discomfort and was found to have large pericardial effusion with tamponade physiology and underwent pericardial window.  Last follow-up with cardiology was 07/23/2014.  Since that time she has been followed by her PCP Dr. Anastasia Pall.  In April 2023 patient was admitted for evaluation of right upper quadrant pain.  Per discharge summary 12/20/2021, "Workup to this point has been unrevealing. Blood cultures are no growth x4, echo and TEE showed no evidence of vegetations. Negative anca titers and GBM antibodies. Pending quant gold. She was seen by pulmonology and ID. Pulm recommending outpatient bronchoscopy. Currently with pending ID studies (hep C RNA quant, histoplasma antigen, blastomyces antigen). Offered inpatient bronch by pulm, but she prefers to wait until outpatient. Will plan to follow outpatient with pulm."  With her arthritis maintained on leflunomide.  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  Type I diabetic maintained on insulin pump.  Last A1c 7.1 on 12/16/2021.  Reviewed history with anesthesiologist Dr. Gifford Shave.  Advised okay to proceed as planned given recent benign echocardiogram findings.  Patient will need day of surgery labs and evaluation.  CT super D chest 04/30/2022: IMPRESSION: 1. Numerous (greater than 20) variably cavitary pulmonary nodules scattered throughout the lungs bilaterally, most numerous in the posterior lower lobes, several  which are mildly increased in size and density since 01/20/2022 chest CT as detailed. While potentially due to waxing rheumatoid nodules, malignancy cannot be excluded by imaging in this high risk patient. 2. No thoracic adenopathy. 3. Three-vessel coronary atherosclerosis. 4. Indistinct heterogeneous hypodense 1.3 cm right liver dome lesion, not appreciably changed, more likely benign. 5. Moderate emphysema with diffuse bronchial wall thickening, suggesting COPD. 6. Aortic Atherosclerosis (ICD10-I70.0) and Emphysema (ICD10-J43.9).  TEE 12/20/2021: 1. Left ventricular ejection fraction, by estimation, is 60 to 65%. The  left ventricle has normal function. The left ventricle has no regional  wall motion abnormalities. There is mild concentric left ventricular  hypertrophy.  2. Right ventricular systolic function is normal. The right ventricular  size is normal.  3. No left atrial/left atrial appendage thrombus was detected.  4. The mitral valve is normal in structure. Mild mitral valve  regurgitation. No evidence of mitral stenosis.  5. The aortic valve is tricuspid. Aortic valve regurgitation is trivial.  No aortic stenosis is present.  6. There is Moderate (Grade III) protruding plaque involving the  descending aorta.  7. The inferior vena cava is normal in size with greater than 50%  respiratory variability, suggesting right atrial pressure of 3 mmHg.   Conclusion(s)/Recommendation(s): No evidence of vegetation/infective  endocarditis on this transesophageael echocardiogram.    TTE 12/16/2021: 1. Left ventricular ejection fraction, by estimation, is 65 to 70%. The  left ventricle has normal function. The left ventricle has no regional  wall motion abnormalities. There is moderate concentric left ventricular  hypertrophy. Left ventricular  diastolic parameters were normal.  2. Right ventricular systolic function is normal. The right ventricular  size is normal. There is  normal pulmonary artery systolic pressure.  3. The mitral valve is normal in structure. Trivial mitral valve  regurgitation. No evidence of mitral stenosis.  4. The aortic valve is tricuspid. Aortic valve regurgitation is not  visualized. No aortic stenosis is present.  5. The inferior vena cava is normal in size with greater than 50%  respiratory variability, suggesting right atrial pressure of 3 mmHg.   Comparison(s): No prior Echocardiogram.   Conclusion(s)/Recommendation(s): Normal biventricular function without  evidence of hemodynamically significant valvular heart disease.    )       Anesthesia Quick Evaluation

## 2022-06-03 LAB — SPECIMEN STATUS REPORT

## 2022-06-03 LAB — NOVEL CORONAVIRUS, NAA: SARS-CoV-2, NAA: NOT DETECTED

## 2022-06-05 ENCOUNTER — Ambulatory Visit (HOSPITAL_COMMUNITY): Payer: Medicare Other

## 2022-06-05 ENCOUNTER — Ambulatory Visit (HOSPITAL_COMMUNITY): Payer: Medicare Other | Admitting: Physician Assistant

## 2022-06-05 ENCOUNTER — Ambulatory Visit (HOSPITAL_BASED_OUTPATIENT_CLINIC_OR_DEPARTMENT_OTHER): Payer: Medicare Other | Admitting: Physician Assistant

## 2022-06-05 ENCOUNTER — Encounter (HOSPITAL_COMMUNITY)
Admission: RE | Disposition: A | Discharge status: Home / Self Care | Payer: Self-pay | Source: Ambulatory Visit | Attending: Emergency Medicine

## 2022-06-05 ENCOUNTER — Ambulatory Visit (HOSPITAL_COMMUNITY)
Admission: RE | Admit: 2022-06-05 | Discharge: 2022-06-05 | Disposition: A | Discharge status: Home / Self Care | Payer: Medicare Other | Source: Ambulatory Visit | Attending: Emergency Medicine | Admitting: Emergency Medicine

## 2022-06-05 DIAGNOSIS — J449 Chronic obstructive pulmonary disease, unspecified: Secondary | ICD-10-CM | POA: Insufficient documentation

## 2022-06-05 DIAGNOSIS — K219 Gastro-esophageal reflux disease without esophagitis: Secondary | ICD-10-CM | POA: Diagnosis not present

## 2022-06-05 DIAGNOSIS — F32A Depression, unspecified: Secondary | ICD-10-CM | POA: Diagnosis not present

## 2022-06-05 DIAGNOSIS — I509 Heart failure, unspecified: Secondary | ICD-10-CM | POA: Insufficient documentation

## 2022-06-05 DIAGNOSIS — I2510 Atherosclerotic heart disease of native coronary artery without angina pectoris: Secondary | ICD-10-CM | POA: Insufficient documentation

## 2022-06-05 DIAGNOSIS — Z79899 Other long term (current) drug therapy: Secondary | ICD-10-CM | POA: Diagnosis not present

## 2022-06-05 DIAGNOSIS — I10 Essential (primary) hypertension: Secondary | ICD-10-CM | POA: Insufficient documentation

## 2022-06-05 DIAGNOSIS — R918 Other nonspecific abnormal finding of lung field: Secondary | ICD-10-CM | POA: Diagnosis present

## 2022-06-05 DIAGNOSIS — Z794 Long term (current) use of insulin: Secondary | ICD-10-CM | POA: Diagnosis not present

## 2022-06-05 DIAGNOSIS — E109 Type 1 diabetes mellitus without complications: Secondary | ICD-10-CM | POA: Diagnosis not present

## 2022-06-05 DIAGNOSIS — M069 Rheumatoid arthritis, unspecified: Secondary | ICD-10-CM | POA: Insufficient documentation

## 2022-06-05 DIAGNOSIS — I110 Hypertensive heart disease with heart failure: Secondary | ICD-10-CM

## 2022-06-05 DIAGNOSIS — Z955 Presence of coronary angioplasty implant and graft: Secondary | ICD-10-CM | POA: Insufficient documentation

## 2022-06-05 DIAGNOSIS — Z9641 Presence of insulin pump (external) (internal): Secondary | ICD-10-CM | POA: Diagnosis not present

## 2022-06-05 DIAGNOSIS — F17210 Nicotine dependence, cigarettes, uncomplicated: Secondary | ICD-10-CM | POA: Insufficient documentation

## 2022-06-05 DIAGNOSIS — Z7969 Long term (current) use of other immunomodulators and immunosuppressants: Secondary | ICD-10-CM | POA: Diagnosis not present

## 2022-06-05 DIAGNOSIS — F419 Anxiety disorder, unspecified: Secondary | ICD-10-CM | POA: Diagnosis not present

## 2022-06-05 HISTORY — DX: Inflammatory liver disease, unspecified: K75.9

## 2022-06-05 HISTORY — DX: Depression, unspecified: F32.A

## 2022-06-05 HISTORY — DX: Heart failure, unspecified: I50.9

## 2022-06-05 HISTORY — DX: Gastro-esophageal reflux disease without esophagitis: K21.9

## 2022-06-05 HISTORY — PX: VIDEO BRONCHOSCOPY WITH RADIAL ENDOBRONCHIAL ULTRASOUND: SHX6849

## 2022-06-05 HISTORY — DX: Dyspnea, unspecified: R06.00

## 2022-06-05 HISTORY — DX: Myoneural disorder, unspecified: G70.9

## 2022-06-05 HISTORY — PX: BRONCHIAL BIOPSY: SHX5109

## 2022-06-05 HISTORY — PX: BRONCHIAL BRUSHINGS: SHX5108

## 2022-06-05 HISTORY — DX: Essential (primary) hypertension: I10

## 2022-06-05 HISTORY — PX: BRONCHIAL WASHINGS: SHX5105

## 2022-06-05 HISTORY — PX: BRONCHIAL NEEDLE ASPIRATION BIOPSY: SHX5106

## 2022-06-05 HISTORY — DX: Anxiety disorder, unspecified: F41.9

## 2022-06-05 LAB — GLUCOSE, CAPILLARY
Glucose-Capillary: 191 mg/dL — ABNORMAL HIGH (ref 70–99)
Glucose-Capillary: 191 mg/dL — ABNORMAL HIGH (ref 70–99)

## 2022-06-05 LAB — CBC
HCT: 40.2 % (ref 36.0–46.0)
Hemoglobin: 13 g/dL (ref 12.0–15.0)
MCH: 31.6 pg (ref 26.0–34.0)
MCHC: 32.3 g/dL (ref 30.0–36.0)
MCV: 97.8 fL (ref 80.0–100.0)
Platelets: 138 10*3/uL — ABNORMAL LOW (ref 150–400)
RBC: 4.11 MIL/uL (ref 3.87–5.11)
RDW: 14.2 % (ref 11.5–15.5)
WBC: 7 10*3/uL (ref 4.0–10.5)
nRBC: 0 % (ref 0.0–0.2)

## 2022-06-05 LAB — BASIC METABOLIC PANEL
Anion gap: 10 (ref 5–15)
BUN: 17 mg/dL (ref 8–23)
CO2: 20 mmol/L — ABNORMAL LOW (ref 22–32)
Calcium: 9.2 mg/dL (ref 8.9–10.3)
Chloride: 109 mmol/L (ref 98–111)
Creatinine, Ser: 0.6 mg/dL (ref 0.44–1.00)
GFR, Estimated: >60 mL/min (ref >60)
Glucose, Bld: 201 mg/dL — ABNORMAL HIGH (ref 70–99)
Potassium: 4.4 mmol/L (ref 3.5–5.1)
Sodium: 139 mmol/L (ref 135–145)

## 2022-06-05 SURGERY — BRONCHOSCOPY, WITH BIOPSY USING ELECTROMAGNETIC NAVIGATION
Anesthesia: General | Laterality: Bilateral

## 2022-06-05 MED ORDER — FENTANYL CITRATE (PF) 100 MCG/2ML IJ SOLN: 50.0000 ug | Freq: Once | INTRAMUSCULAR | Status: AC

## 2022-06-05 MED ORDER — ACETAMINOPHEN 500 MG PO TABS
ORAL_TABLET | ORAL | Status: AC
  Administered 2022-06-05: 1000 mg via ORAL
  Filled 2022-06-05: qty 2

## 2022-06-05 MED ORDER — FENTANYL CITRATE (PF) 100 MCG/2ML IJ SOLN
50.0000 ug | Freq: Once | INTRAMUSCULAR | Status: AC
  Administered 2022-06-05: 50 ug via INTRAVENOUS

## 2022-06-05 MED ORDER — EPHEDRINE SULFATE-NACL 50-0.9 MG/10ML-% IV SOSY
PREFILLED_SYRINGE | INTRAVENOUS | Status: DC | PRN
  Administered 2022-06-05 (×2): 5 mg via INTRAVENOUS

## 2022-06-05 MED ORDER — ONDANSETRON HCL 4 MG/2ML IJ SOLN
INTRAMUSCULAR | Status: DC | PRN
  Administered 2022-06-05: 4 mg via INTRAVENOUS

## 2022-06-05 MED ORDER — FENTANYL CITRATE (PF) 100 MCG/2ML IJ SOLN
INTRAMUSCULAR | Status: AC
  Administered 2022-06-05: 50 ug via INTRAVENOUS
  Filled 2022-06-05: qty 2

## 2022-06-05 MED ORDER — PHENYLEPHRINE 80 MCG/ML (10ML) SYRINGE FOR IV PUSH (FOR BLOOD PRESSURE SUPPORT)
PREFILLED_SYRINGE | INTRAVENOUS | Status: DC | PRN
  Administered 2022-06-05: 120 ug via INTRAVENOUS
  Administered 2022-06-05 (×2): 40 ug via INTRAVENOUS
  Administered 2022-06-05: 120 ug via INTRAVENOUS
  Administered 2022-06-05: 80 ug via INTRAVENOUS

## 2022-06-05 MED ORDER — PHENYLEPHRINE HCL-NACL 20-0.9 MG/250ML-% IV SOLN
INTRAVENOUS | Status: DC | PRN
  Administered 2022-06-05: 50 ug/min via INTRAVENOUS

## 2022-06-05 MED ORDER — ROCURONIUM BROMIDE 10 MG/ML (PF) SYRINGE
PREFILLED_SYRINGE | INTRAVENOUS | Status: DC | PRN
  Administered 2022-06-05: 40 mg via INTRAVENOUS
  Administered 2022-06-05: 50 mg via INTRAVENOUS

## 2022-06-05 MED ORDER — PROPOFOL 500 MG/50ML IV EMUL
INTRAVENOUS | Status: DC | PRN
  Administered 2022-06-05: 150 ug/kg/min via INTRAVENOUS

## 2022-06-05 MED ORDER — HUMALOG 100 UNIT/ML ~~LOC~~ SOLN: 30.0000 [IU] | SUBCUTANEOUS | Status: AC

## 2022-06-05 MED ORDER — PROPOFOL 10 MG/ML IV BOLUS
INTRAVENOUS | Status: DC | PRN
  Administered 2022-06-05: 90 mg via INTRAVENOUS

## 2022-06-05 MED ORDER — CHLORHEXIDINE GLUCONATE 0.12 % MT SOLN
OROMUCOSAL | Status: AC
  Filled 2022-06-05: qty 15

## 2022-06-05 MED ORDER — INSULIN ASPART 100 UNIT/ML IJ SOLN
4.0000 [IU] | Freq: Once | INTRAMUSCULAR | Status: AC
  Administered 2022-06-05: 4 [IU] via SUBCUTANEOUS
  Filled 2022-06-05: qty 1

## 2022-06-05 MED ORDER — ACETAMINOPHEN 500 MG PO TABS: 1000.0000 mg | ORAL_TABLET | Freq: Once | ORAL | Status: AC

## 2022-06-05 MED ORDER — SUGAMMADEX SODIUM 200 MG/2ML IV SOLN
INTRAVENOUS | Status: DC | PRN
  Administered 2022-06-05: 200 mg via INTRAVENOUS

## 2022-06-05 MED ORDER — LACTATED RINGERS IV SOLN: INTRAVENOUS | Status: DC

## 2022-06-05 MED ORDER — LIDOCAINE 2% (20 MG/ML) 5 ML SYRINGE
INTRAMUSCULAR | Status: DC | PRN
  Administered 2022-06-05: 60 mg via INTRAVENOUS

## 2022-06-05 NOTE — Interval H&P Note (Signed)
History and Physical Interval Note:  06/05/2022 10:51 AM  Nichole Santiago  has presented today for surgery, with the diagnosis of BILATERAL PULMONARY NODULES.  The various methods of treatment have been discussed with the patient and family. After consideration of risks, benefits and other options for treatment, the patient has consented to  Procedure(s): ROBOTIC ASSISTED NAVIGATIONAL BRONCHOSCOPY (Bilateral) as a surgical intervention.  The patient's history has been reviewed, patient examined, no change in status, stable for surgery.  I have reviewed the patient's chart and labs.  Questions were answered to the patient's satisfaction.     Collene Gobble

## 2022-06-05 NOTE — Transfer of Care (Signed)
Immediate Anesthesia Transfer of Care Note  Patient: Nichole Santiago  Procedure(s) Performed: ROBOTIC ASSISTED NAVIGATIONAL BRONCHOSCOPY (Bilateral) VIDEO BRONCHOSCOPY WITH RADIAL ENDOBRONCHIAL ULTRASOUND BRONCHIAL BRUSHINGS BRONCHIAL NEEDLE ASPIRATION BIOPSIES BRONCHIAL BIOPSIES BRONCHIAL WASHINGS  Patient Location: PACU endo  Anesthesia Type:General  Level of Consciousness: awake and alert   Airway & Oxygen Therapy: Patient Spontanous Breathing and Patient connected to nasal cannula oxygen  Post-op Assessment: Report given to RN and Post -op Vital signs reviewed and stable  Post vital signs: Reviewed and stable  Last Vitals:  Vitals Value Taken Time  BP 122/66 06/05/22 1430  Temp 36.6 C 06/05/22 1423  Pulse 73 06/05/22 1431  Resp 23 06/05/22 1431  SpO2 96 % 06/05/22 1431  Vitals shown include unvalidated device data.  Last Pain:  Vitals:   06/05/22 1423  TempSrc: Temporal  PainSc: 0-No pain      Patients Stated Pain Goal: 2 (60/04/59 9774)  Complications: No notable events documented.

## 2022-06-05 NOTE — Op Note (Signed)
Video Bronchoscopy with Robotic Assisted Bronchoscopic Navigation   Date of Operation: 06/05/2022   Pre-op Diagnosis: Multiple bilateral cavitary pulmonary nodules  Post-op Diagnosis: Same  Surgeon: Baltazar Apo  Assistants: None  Anesthesia: General endotracheal anesthesia  Operation: Flexible video fiberoptic bronchoscopy with robotic assistance and biopsies.  Estimated Blood Loss: Minimal  Complications: None  Indications and History: Nichole Santiago is a 71 y.o. female with history of tobacco use, rheumatoid arthritis on immunosuppression.  She has progressive bilateral partial cavitary pulmonary nodules.  Recommendation made to achieve cytology and microbiology via robotic assisted navigational bronchoscopy. The risks, benefits, complications, treatment options and expected outcomes were discussed with the patient.  The possibilities of pneumothorax, pneumonia, reaction to medication, pulmonary aspiration, perforation of a viscus, bleeding, failure to diagnose a condition and creating a complication requiring transfusion or operation were discussed with the patient who freely signed the consent.    Description of Procedure: The patient was seen in the Preoperative Area, was examined and was deemed appropriate to proceed.  The patient was taken to [facility] endoscopy room 3, identified as [name] and the procedure verified as Flexible Video Fiberoptic Bronchoscopy.  A Time Out was held and the above information confirmed.   Prior to the date of the procedure a high-resolution CT scan of the chest was performed. Utilizing ION software program a virtual tracheobronchial tree was generated to allow the creation of distinct navigation pathways to the patient's parenchymal abnormalities. After being taken to the operating room general anesthesia was initiated and the patient  was orally intubated. The video fiberoptic bronchoscope was introduced via the endotracheal tube and a general  inspection was performed which showed normal right and left lung anatomy. Aspiration of the bilateral mainstems was completed to remove any remaining secretions. Robotic catheter inserted into patient's endotracheal tube.   Target #1 right lower lobe cavitary pulmonary nodule: The distinct navigation pathways prepared prior to this procedure were then utilized to navigate to patient's lesion identified on CT scan. The robotic catheter was secured into place and the vision probe was withdrawn.  Lesion location was approximated using fluoroscopy and radial endobronchial ultrasound for peripheral targeting.  Local registration and targeting was performed using Cios three-dimensional imaging.  Under fluoroscopic guidance transbronchial brushings, transbronchial needle biopsies, and transbronchial forceps biopsies were performed to be sent for cytology and pathology. A bronchioalveolar lavage was performed in the right lower lobe adjacent to the nodule and sent for microbiology.  Target #2 right upper lobe pulmonary nodule: The distinct navigation pathways prepared prior to this procedure were then utilized to navigate to patient's lesion identified on CT scan. The robotic catheter was secured into place and the vision probe was withdrawn.  Lesion location was approximated using fluoroscopy and radial endobronchial ultrasound for peripheral targeting. Local registration and targeting was performed using Cios three-dimensional imaging. Under fluoroscopic guidance transbronchial brushings, transbronchial needle biopsies, and transbronchial forceps biopsies were performed to be sent for cytology and pathology. A bronchioalveolar lavage was performed in the right upper lobe adjacent to the nodule and sent for microbiology.  Target #3 right lower lobe cavitary pulmonary nodule: The distinct navigation pathways prepared prior to this procedure were then utilized to navigate to patient's lesion identified on CT scan.  The robotic catheter was secured into place and the vision probe was withdrawn.  Lesion location was approximated using fluoroscopy and radial endobronchial ultrasound for peripheral targeting.  Local registration and targeting was performed using Cios three-dimensional imaging.  Under fluoroscopic guidance  transbronchial brushings were performed to be sent for cytology and pathology. A bronchioalveolar lavage was performed in the right lower lobe and sent for microbiology.  At the end of the procedure a general airway inspection was performed and there was no evidence of active bleeding. The bronchoscope was removed.  The patient tolerated the procedure well. There was no significant blood loss and there were no obvious complications. A post-procedural chest x-ray is pending.  Samples Target #1: 1. Transbronchial brushings from right lower lobe cavitary nodule 2. Transbronchial Wang needle biopsies from right lower lobe cavitary nodule 3. Transbronchial forceps biopsies from right lower lobe cavitary nodule 4. Bronchoalveolar lavage from right lower lobe  Samples Target #2: 1. Transbronchial brushings from right upper lobe nodule 2. Transbronchial Wang needle biopsies from right upper lobe nodule 3. Transbronchial forceps biopsies from right upper lobe nodule 4. Bronchoalveolar lavage from right upper lobe  Samples Target #3: 1. Transbronchial brushings from right lower lobe cavitary nodule 2. Bronchoalveolar lavage from right lower lobe  Plans:  The patient will be discharged from the PACU to home when recovered from anesthesia and after chest x-ray is reviewed. We will review the cytology, pathology and microbiology results with the patient when they become available. Outpatient followup will be with Dr. Lamonte Sakai.    Baltazar Apo, MD, PhD 06/05/2022, 2:20 PM [facility] [phone] or if no answer before 7:00PM call [phone] For any issues after 7:00PM please  call eLink [phone]

## 2022-06-05 NOTE — Discharge Instructions (Signed)
Flexible Bronchoscopy, Care After This sheet gives you information about how to care for yourself after your test. Your doctor may also give you more specific instructions. If you have problems or questions, contact your doctor. Follow these instructions at home: Eating and drinking When your numbness is gone and your cough and gag reflexes have come back, you may: Eat only soft foods. Slowly drink liquids. The day after the test, go back to your normal diet. Driving Do not drive for 24 hours if you were given a medicine to help you relax (sedative). Do not drive or use heavy machinery while taking prescription pain medicine. General instructions  Take over-the-counter and prescription medicines only as told by your doctor. Return to your normal activities as told. Ask what activities are safe for you. Do not use any products that have nicotine or tobacco in them. This includes cigarettes and e-cigarettes. If you need help quitting, ask your doctor. Keep all follow-up visits as told by your doctor. This is important. It is very important if you had a tissue sample (biopsy) taken. Get help right away if: You have shortness of breath that gets worse. You get light-headed. You feel like you are going to pass out (faint). You have chest pain. You cough up: More than a little blood. More blood than before. Summary Do not eat or drink anything (not even water) for 2 hours after your test, or until your numbing medicine wears off. Do not use cigarettes. Do not use e-cigarettes. Get help right away if you have chest pain.  Please call our office for any questions or concerns.  [phone].  This information is not intended to replace advice given to you by your health care provider. Make sure you discuss any questions you have with your health care provider. Document Released: 06/25/2009 Document Revised: 08/10/2017 Document Reviewed: 09/15/2016 Elsevier Patient Education  2020 Elsevier  Inc.  

## 2022-06-05 NOTE — Anesthesia Postprocedure Evaluation (Signed)
Anesthesia Post Note  Patient: [name]  Procedure(s) Performed: ROBOTIC ASSISTED NAVIGATIONAL BRONCHOSCOPY (Bilateral) VIDEO BRONCHOSCOPY WITH RADIAL ENDOBRONCHIAL ULTRASOUND BRONCHIAL BRUSHINGS BRONCHIAL NEEDLE ASPIRATION BIOPSIES BRONCHIAL BIOPSIES BRONCHIAL WASHINGS     Patient location during evaluation: PACU Anesthesia Type: General Level of consciousness: awake and alert Pain management: pain level controlled Vital Signs Assessment: post-procedure vital signs reviewed and stable Respiratory status: spontaneous breathing, nonlabored ventilation and respiratory function stable Cardiovascular status: blood pressure returned to baseline and stable Postop Assessment: no apparent nausea or vomiting Anesthetic complications: no   No notable events documented.  Last Vitals:  Vitals:   06/05/22 1453 06/05/22 1503  BP: (!) 129/110 127/69  Pulse: 70 70  Resp: (!) 24 18  Temp:    SpO2: 96% 93%    Last Pain:  Vitals:   06/05/22 1503  TempSrc:   PainSc: 6                  Mishti Swanton,W. EDMOND

## 2022-06-05 NOTE — Anesthesia Procedure Notes (Signed)
Procedure Name: Intubation Date/Time: 06/05/2022 12:37 PM  Performed by: Valda Favia, CRNAPre-anesthesia Checklist: Patient identified, Emergency Drugs available, Suction available and Patient being monitored Patient Re-evaluated:Patient Re-evaluated prior to induction Oxygen Delivery Method: Circle System Utilized Preoxygenation: Pre-oxygenation with 100% oxygen Induction Type: IV induction Ventilation: Mask ventilation without difficulty Laryngoscope Size: Mac and 4 Grade View: Grade I Tube type: Oral Tube size: 8.5 mm Number of attempts: 1 Airway Equipment and Method: Stylet Placement Confirmation: ETT inserted through vocal cords under direct vision, positive ETCO2 and breath sounds checked- equal and bilateral Secured at: 21 cm Tube secured with: Tape Dental Injury: Teeth and Oropharynx as per pre-operative assessment

## 2022-06-05 NOTE — Progress Notes (Signed)
Spoke to Dr. Therisa Doyne regarding patient's CBG and insulin pump which is set on 0.05 unit/hour. Well give patient ordered dose of insulin and continue to monitor

## 2022-06-06 ENCOUNTER — Encounter (HOSPITAL_COMMUNITY): Payer: Self-pay | Admitting: Emergency Medicine

## 2022-06-07 LAB — ACID FAST SMEAR (AFB, MYCOBACTERIA)
Acid Fast Smear: NEGATIVE
Acid Fast Smear: NEGATIVE

## 2022-06-07 LAB — CYTOLOGY - NON PAP

## 2022-06-08 LAB — CULTURE, BAL-QUANTITATIVE W GRAM STAIN
Culture: NO GROWTH
Culture: NO GROWTH
Culture: NO GROWTH

## 2022-06-08 LAB — ACID FAST SMEAR (AFB, MYCOBACTERIA): Acid Fast Smear: NEGATIVE

## 2022-06-10 ENCOUNTER — Telehealth: Payer: Self-pay | Admitting: Emergency Medicine

## 2022-06-10 LAB — AEROBIC/ANAEROBIC CULTURE W GRAM STAIN (SURGICAL/DEEP WOUND)
Culture: NO GROWTH
Culture: NO GROWTH
Culture: NO GROWTH

## 2022-06-10 NOTE — Telephone Encounter (Signed)
Reviewed bronchoscopy results with the patient by phone.  All of her nodular biopsies are consistent with inflammation, no evidence of malignancy.  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  Cultures are still pending and we will follow these results when they become available.

## 2022-07-02 LAB — FUNGUS CULTURE RESULT

## 2022-07-02 LAB — FUNGUS CULTURE WITH STAIN

## 2022-07-02 LAB — FUNGAL ORGANISM REFLEX

## 2022-07-06 ENCOUNTER — Encounter: Payer: Self-pay | Admitting: Emergency Medicine

## 2022-07-06 ENCOUNTER — Ambulatory Visit: Payer: Medicare Other | Admitting: Emergency Medicine

## 2022-07-06 DIAGNOSIS — R918 Other nonspecific abnormal finding of lung field: Secondary | ICD-10-CM

## 2022-07-06 DIAGNOSIS — Z720 Tobacco use: Secondary | ICD-10-CM | POA: Diagnosis not present

## 2022-07-06 LAB — FUNGUS CULTURE WITH STAIN

## 2022-07-06 LAB — FUNGAL ORGANISM REFLEX

## 2022-07-06 LAB — FUNGUS CULTURE RESULT

## 2022-07-06 NOTE — Progress Notes (Signed)
Subjective:    Patient ID: Nichole Santiago, female    DOB: [birth year], 71 y.o.   MRN: [id number]  HPI  ROV 06/01/22 --follow-up visit for 71 year old woman with a history of active tobacco use, rheumatoid arthritis on immunosuppression, CAD, diabetes.  I saw her in follow-up after hospital admission in April 2023 for cavitary pulmonary nodular disease.  She was treated empirically for possible embolic infection although TEE was negative for endocarditis.  PET scan 01/20/2022 was negative for hypermetabolism in these nodules.  We discussed possible bronchoscopy for culture data and tissue diagnosis, ultimately decided to follow with serial imaging.  Today she reports exertional SOB with walking. SOB w housework. Daily cough - non-productive.   CT chest 05/03/2022 reviewed by me, shows numerous variably cavitary pulmonary nodules bilaterally, some of which have increased in size compared with 01/20/2022  ROV 07/06/22 --71 year old woman with a history of tobacco use, rheumatoid arthritis on Imuran suppression, CAD, diabetes.  She has cavitary pulmonary nodular disease noted on hospital admission April/2023.  She underwent bronchoscopy 06/05/2022 to further evaluate.  Pathology showed no evidence of malignancy on any of the nodular biopsies.  There were pulmonary macrophages and evidence for chronic inflammatory change fungal cultures and smears are all negative.  Her AFB smears are negative, final AFB cultures are still pending but negative so far. She has been thinking about starting Humira, has not to date. She is on leflunamide.  She never tried the [employer]. She was worried that it would make her cough.    Review of Systems As per HPI  Past Medical History:  Diagnosis Date   Anxiety    CAD (coronary artery disease)    Cardiac tamponade    Cardiac/pericardial tamponade    Cerebral aneurysm    CHF (congestive heart failure) (HCC)    Depression    Diabetes mellitus without complication (HCC)     Type 1   Dyspnea    with exertion   Foot ulcer (HCC)    GERD (gastroesophageal reflux disease)    Hepatitis    Hepatitis C - was on a clinical trial and no longer has it   Hypertension    Insulin pump in place    Neuromuscular disorder (HCC)    neuropathy in feet and legs   Osteomyelitis of ankle or foot, acute, left (HCC)    RA (rheumatoid arthritis) ([place])    Unstable angina ([place])    2015     No family history on file.   Social History   Socioeconomic History   Marital status: Widowed    Spouse name: Not on file   Number of children: Not on file   Years of education: Not on file   Highest education level: Not on file  Occupational History   Not on file  Tobacco Use   Smoking status: Every Day    Packs/day: 0.50    Years: 50.00    Total pack years: 25.00    Types: Cigarettes   Smokeless tobacco: Never   Tobacco comments:    Stopped smoking in April 2023 ARJ 01/25/22  Vaping Use   Vaping Use: Former  Substance and Sexual Activity   Alcohol use: Not Currently   Drug use: Never   Sexual activity: Not on file  Other Topics Concern   Not on file  Social History Narrative   Not on file   Social Determinants of Health   Financial Resource Strain: Not on file  Food Insecurity: Not on file  Transportation Needs: Not on file  Physical Activity: Not on file  Stress: Not on file  Social Connections: Not on file  Intimate Partner Violence: Not on file     Allergies  Allergen Reactions   Ace Inhibitors Cough     pt states not allergic     Outpatient Medications Prior to Visit  Medication Sig Dispense Refill   aspirin EC 81 MG tablet Take 81 mg by mouth daily.     glucose blood test strip TEST 6 TIMES DAILY     insulin lispro (HUMALOG) 100 UNIT/ML injection Inject 0.3-0.5 mLs (30-50 Units total) into the skin See admin instructions. Use up to 50 units a day in the insulin pump     leflunomide (ARAVA) 20 MG tablet Take 20 mg by mouth daily.     losartan (COZAAR)  25 MG tablet Take 12.5 tablets by mouth daily.     metoprolol succinate (TOPROL-XL) 25 MG 24 hr tablet Take 25 mg by mouth every morning.     ondansetron (ZOFRAN-ODT) 4 MG disintegrating tablet Take 4 mg by mouth every 8 (eight) hours as needed for nausea or vomiting.     ONETOUCH DELICA LANCETS 52W MISC by Does not apply route.     oxyCODONE (OXY IR/ROXICODONE) 5 MG immediate release tablet Take 5 mg by mouth 2 (two) times daily as needed for moderate pain.     pregabalin (LYRICA) 75 MG capsule Take 75 mg by mouth 2 (two) times daily.     promethazine (PHENERGAN) 25 MG tablet Take 25 mg by mouth every 6 (six) hours as needed for nausea or vomiting.     rosuvastatin (CRESTOR) 40 MG tablet Take 40 mg by mouth at bedtime.     sertraline (ZOLOFT) 100 MG tablet Take 100 mg by mouth daily.     Tiotropium Bromide-Olodaterol (STIOLTO RESPIMAT) 2.5-2.5 MCG/ACT AERS Inhale 2 puffs into the lungs daily. 4 g 0   Glucagon, rDNA, (GLUCAGON EMERGENCY) 1 MG KIT Inject under skin 1 mg as needed for hypoglycemia (Patient not taking: Reported on 06/02/2022) 1 kit 11   No facility-administered medications prior to visit.         Objective:   Physical Exam  Vitals:   07/06/22 1552  BP: (!) 120/52  Pulse: 76  Temp: 98.3 F (36.8 C)  TempSrc: Oral  SpO2: 93%  Weight: 124 lb (56.2 kg)  Height: 5' 6"  (1.676 m)   Gen: Pleasant, thin elderly woman, in no distress,  normal affect, wheelchair  ENT: No lesions,  mouth clear,  oropharynx clear, no postnasal drip  Neck: No JVD, no stridor  Lungs: No use of accessory muscles, no crackles or wheezing on normal respiration, no wheeze on forced expiration  Cardiovascular: RRR, heart sounds normal, no murmur or gallops, no peripheral edema  Musculoskeletal: No deformities, no cyanosis or clubbing  Neuro: alert, awake, non focal  Skin: Warm, no lesions or rash      Assessment & Plan:   Multiple pulmonary nodules Transbronchial biopsies all consistent  with chronic inflammatory disease, pulmonary macrophages.  No evidence of malignancy.  Cultures are negative.  Suspect that these are rheumatoid nodules.  She has discussed possible Humira with her rheumatologist but has never elected to start it.  I want her to revisit with him.  We can follow her CT chest in 1 year to look for interval change, stability.  Tobacco use With presumed COPD.  She never started the [employer].  She wants to avoid starting  any new medications if possible.   Baltazar Apo, MD, PhD 07/06/2022, 4:09 PM [facility] [phone] or if no answer before 7:00PM call [phone] For any issues after 7:00PM please call eLink [phone]

## 2022-07-06 NOTE — Assessment & Plan Note (Signed)
Transbronchial biopsies all consistent with chronic inflammatory disease, pulmonary macrophages.  No evidence of malignancy.  Cultures are negative.  Suspect that these are rheumatoid nodules.  She has discussed possible Humira with her rheumatologist but has never elected to start it.  I want her to revisit with him.  We can follow her CT chest in 1 year to look for interval change, stability.

## 2022-07-06 NOTE — Patient Instructions (Addendum)
We reviewed your biopsy results and culture results today.  There is no evidence of any cancer, no evidence of any active infection.  This is good news.  Your pulmonary nodules are likely rheumatoid nodules.  Please review this information with Dr. Veneta Penton in rheumatology.  There may be benefit to being on additional anti-inflammatory medication. We will plan to repeat your CT scan of the chest without contrast in August 2024. We will not do a trial of Stiolto at this time Follow with Dr. Lamonte Sakai in August 2024 after your CT so we can review the results together.  Please call sooner if you have any problems.

## 2022-07-06 NOTE — Assessment & Plan Note (Signed)
With presumed COPD.  She never started the [employer].  She wants to avoid starting any new medications if possible.

## 2022-07-20 LAB — ACID FAST CULTURE WITH REFLEXED SENSITIVITIES (MYCOBACTERIA)
Acid Fast Culture: NEGATIVE
Acid Fast Culture: NEGATIVE
Acid Fast Culture: NEGATIVE

## 2022-08-15 ENCOUNTER — Telehealth: Payer: Self-pay | Admitting: Emergency Medicine

## 2022-08-15 NOTE — Telephone Encounter (Signed)
Called and spoke with patients daughter [place]. [place] stated that she wanted to know if the patient should start humira or not.   RB, please advise.

## 2022-08-16 NOTE — Telephone Encounter (Signed)
Called and spoke with brittany. [place] verbalized understanding. Nothing further needed.

## 2022-08-16 NOTE — Telephone Encounter (Signed)
Based on our evaluation, her pulmonary nodules are likely associated with her rheumatoid arthritis. If her rheumatologist has recommended starting Humira, then I do not know of any contraindications and I would support starting the medication.

## 2022-08-18 LAB — HISTOPLASMA ANTIGEN, URINE: Histoplasma Antigen, urine: <0.5 (ref <0.5)

## 2023-07-15 IMAGING — PT NM PET TUM IMG INITIAL (PI) SKULL BASE T - THIGH
1 of 7 series · 1 of 25 positions shown · non-contrast
Comparison: Chest CTA and abdominopelvic CT 12/15/2021. Remote
chest CTA 06/26/2014 unavailable.

CLINICAL DATA: Initial treatment strategy for cavitary pulmonary
nodules on chest CT.

EXAM:
NUCLEAR MEDICINE PET SKULL BASE TO THIGH
TECHNIQUE: 6.4 mCi F-18 FDG was injected intravenously. Full-ring PET imaging
was performed from the skull base to thigh after the radiotracer. CT
data was obtained and used for attenuation correction and anatomic
localization.
Fasting blood glucose: 213 mg/dl

[Series 5: ct sk_thigh 5.0 br38 · axial · 5.0mm · 0.98mm/px · 1 of 210 slices shown]
[im 210/210  brain]
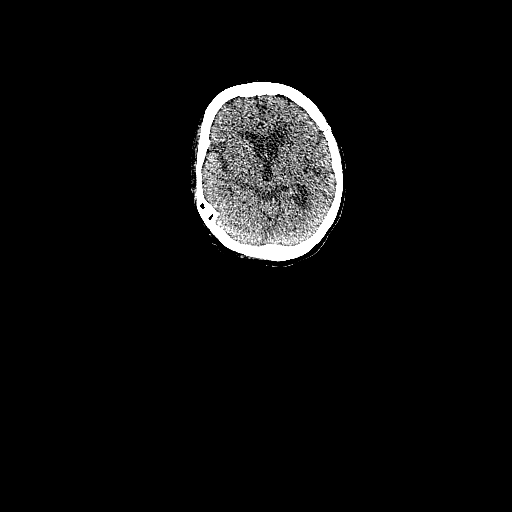

[1 of 25 positions shown; findings below may reference images not displayed]

FINDINGS: Mediastinal blood pool activity: SUV max

NECK:

No hypermetabolic cervical lymph nodes are identified.There are no
lesions of the pharyngeal mucosal space. Mild motion artifact on the
imaging through the head and neck.

Incidental CT findings: Bilateral carotid atherosclerosis.

CHEST:

There are no hypermetabolic mediastinal, hilar or axillary lymph
nodes. There is only low level hypermetabolic activity associated
with the cavitary pulmonary nodules bilaterally. Right lower lobe
lesion measuring 2.1 x 1.5 cm on image 43/10 has an SUV max of 3.5.

Incidental CT findings: Deferred to separate CT same date.

ABDOMEN/PELVIS:

There is no hypermetabolic activity within the liver, adrenal
glands, spleen or pancreas. There is no hypermetabolic nodal
activity.

Incidental CT findings: Previously demonstrated renal cysts
bilaterally without hypermetabolic activity. Diffuse aortic and
branch vessel atherosclerosis. Moderate stool throughout the colon.

SKELETON:

There is no hypermetabolic activity to suggest osseous metastatic
disease.

Incidental CT findings: Multilevel spondylosis associated with a
convex left thoracolumbar scoliosis. Large peripherally calcified
disc extrusions in the lower thoracic spine are grossly stable.
IMPRESSION: 1. The multiple cavitary lung nodule seen on recent chest CT
demonstrate only low level hypermetabolic activity. The greatest
metabolic activity is within the largest right lower lobe lesion
(SUV max 3.5). These nodules have not significantly changed in size
over the last 5 weeks. The stability argues against septic emboli,
and the lack of significant hypermetabolic activity makes metastatic
squamous cell carcinoma unlikely. Given the patient's history,
consider rheumatoid nodules. If biopsy not performed, recommend
chest CT follow-up in 3-6 months.
2. No hypermetabolic thoracic adenopathy.
3. Incidental findings as above, including large peripherally
calcified disc extrusions in the lower thoracic spine and Aortic
Atherosclerosis ([id number]).

## 2023-08-03 ENCOUNTER — Ambulatory Visit: Payer: Medicare Other

## 2023-08-13 ENCOUNTER — Ambulatory Visit: Payer: Medicare Other | Attending: Family Medicine

## 2023-08-13 ENCOUNTER — Other Ambulatory Visit: Payer: Self-pay

## 2023-08-13 DIAGNOSIS — M6281 Muscle weakness (generalized): Secondary | ICD-10-CM | POA: Diagnosis present

## 2023-08-13 DIAGNOSIS — R2681 Unsteadiness on feet: Secondary | ICD-10-CM | POA: Insufficient documentation

## 2023-08-13 NOTE — Therapy (Signed)
OUTPATIENT PHYSICAL THERAPY LOWER EXTREMITY EVALUATION   Patient Name: Nichole Santiago MRN: [id number] DOB:05-24-1951, 72 y.o., female Today's Date: 08/13/2023  END OF SESSION:  PT End of Session - 08/13/23 1357     Visit Number 1    Number of Visits 12    Date for PT Re-Evaluation 11/09/23    PT Start Time 1358    PT Stop Time 1427    PT Time Calculation (min) 29 min    Activity Tolerance Patient tolerated treatment well    Behavior During Therapy WFL for tasks assessed/performed             Past Medical History:  Diagnosis Date   Anxiety    CAD (coronary artery disease)    Cardiac tamponade    Cardiac/pericardial tamponade    Cerebral aneurysm    CHF (congestive heart failure) (HCC)    Depression    Diabetes mellitus without complication (HCC)    Type 1   Dyspnea    with exertion   Foot ulcer (HCC)    GERD (gastroesophageal reflux disease)    Hepatitis    Hepatitis C - was on a clinical trial and no longer has it   Hypertension    Insulin pump in place    Neuromuscular disorder (HCC)    neuropathy in feet and legs   Osteomyelitis of ankle or foot, acute, left (HCC)    RA (rheumatoid arthritis) (HCC)    Unstable angina (HCC)    2015   Past Surgical History:  Procedure Laterality Date   BRONCHIAL BIOPSY  06/05/2022   Procedure: BRONCHIAL BIOPSIES;  Surgeon: Leslye Peer, MD;  Location: [facility]COPY;  Service: Pulmonary;;   BRONCHIAL BRUSHINGS  06/05/2022   Procedure: BRONCHIAL BRUSHINGS;  Surgeon: Leslye Peer, MD;  Location: [facility];  Service: Pulmonary;;   BRONCHIAL NEEDLE ASPIRATION BIOPSY  06/05/2022   Procedure: BRONCHIAL NEEDLE ASPIRATION BIOPSIES;  Surgeon: Leslye Peer, MD;  Location: [facility]OSCOPY;  Service: Pulmonary;;   BRONCHIAL WASHINGS  06/05/2022   Procedure: BRONCHIAL WASHINGS;  Surgeon: Leslye Peer, MD;  Location: [facility];  Service: Pulmonary;;   CARDIAC CATHETERIZATION     stent placed 2015   CEREBRAL ANEURYSM REPAIR   2017   COLONOSCOPY     PARTIAL HYSTERECTOMY     TEE WITHOUT CARDIOVERSION N/A 12/20/2021   Procedure: TRANSESOPHAGEAL ECHOCARDIOGRAM (TEE);  Surgeon: Thurmon Fair, MD;  Location: [facility] ENDOSCOPY;  Service: Cardiovascular;  Laterality: N/A;   TONSILLECTOMY     VIDEO BRONCHOSCOPY WITH RADIAL ENDOBRONCHIAL ULTRASOUND  06/05/2022   Procedure: VIDEO BRONCHOSCOPY WITH RADIAL ENDOBRONCHIAL ULTRASOUND;  Surgeon: Leslye Peer, MD;  Location: [facility] ENDOSCOPY;  Service: Pulmonary;;   Patient Active Problem List   Diagnosis Date Noted   Tobacco use 06/01/2022   Atherosclerosis 12/20/2021   Septic embolism (HCC)    Thrombocytopenia (HCC) 12/19/2021   Multiple pulmonary nodules 12/16/2021   Liver lesion, right lobe 12/16/2021   Dehydration 12/16/2021   Diabetes mellitus without complication (HCC)    RA (rheumatoid arthritis) (HCC)    HTN (hypertension)    HLD (hyperlipidemia)    Abdominal pain 12/15/2021   Uncontrolled type 1 diabetes mellitus with circulatory complication 02/01/2018   REFERRING PROVIDER: Beam, Chales Salmon, MD   REFERRING DIAG: Unsteadiness on feet; Repeated falls; Physical deconditioning   THERAPY DIAG:  Muscle weakness (generalized)  Unsteadiness on feet  Rationale for Evaluation and Treatment: Rehabilitation  ONSET DATE: years ago  SUBJECTIVE:   SUBJECTIVE STATEMENT: Patient  reports that she has been falling off and on for a few years now. She feels that nothing will really cause her to fall as her legs with give out on her. Her last fall was about a month ago in the bathroom. She feels like her legs do not want to do what they should do. She has tried using a walker, but she feels like it gets in her way.   PERTINENT HISTORY: Diabetes type 1, hypertension, rheumatoid arthritis, current smoker, anxiety, congestive heart failure, depression, neuropathy, and unstable angina PAIN:  Are you having pain? No  PRECAUTIONS: Fall  RED FLAGS: None   WEIGHT BEARING  RESTRICTIONS: No  FALLS:  Has patient fallen in last 6 months? Yes. Number of falls 1  LIVING ENVIRONMENT: Lives with: lives with their family Lives in: House/apartment Stairs: No Has following equipment at home: None  OCCUPATION: retired  PLOF: Independent with basic ADLs  PATIENT GOALS: improved safety  NEXT MD VISIT: None scheduled  OBJECTIVE:  Note: Objective measures were completed at Evaluation unless otherwise noted.  COGNITION: Overall cognitive status: Within functional limits for tasks assessed     SENSATION: Light touch: WFL and no significant deficits bilaterally Patient reports numbness in both legs with the right leg more than the left.   EDEMA:  No edema observed  LOWER EXTREMITY ROM: WFL for activities assessed  LOWER EXTREMITY MMT:  MMT Right eval Left eval  Hip flexion 3+/5 3+/5  Hip extension    Hip abduction    Hip adduction    Hip internal rotation    Hip external rotation    Knee flexion 4-/5 4-/5  Knee extension 4+/5 4+/5  Ankle dorsiflexion 4-/5 4-/5  Ankle plantarflexion    Ankle inversion    Ankle eversion     (Blank rows = not tested)  FUNCTIONAL TESTS:  5 times sit to stand: 32.83 seconds with upper extremity support from armrests Timed up and go (TUG): 34.83 seconds with close supervision Required upper extremity support for sit to stand and stand to sit transfers  GAIT: Assistive device utilized: None Level of assistance: CGA Comments: poor foot clearance and stride length bilaterally   TODAY'S TREATMENT:                                                                                                                              DATE:     PATIENT EDUCATION:  Education details: plan of care, safety, using an assistive device, prognosis, and goals for therapy Person educated: Patient and Child(ren) Education method: Explanation Education comprehension: verbalized understanding  HOME EXERCISE  PROGRAM:   ASSESSMENT:  CLINICAL IMPRESSION: Patient is a 72 y.o. female who was seen today for physical therapy evaluation and treatment for deconditioning and a history of falling. She is at a high risk of falling as evidenced by her gait mechanics, objective measures, and history of falling. Recommend that she continue with skilled physical therapy to address  her remaining impairments to maximize her safety and functional mobility.    OBJECTIVE IMPAIRMENTS: Abnormal gait, decreased activity tolerance, decreased balance, decreased mobility, difficulty walking, decreased strength, and postural dysfunction.   ACTIVITY LIMITATIONS: carrying, standing, transfers, and locomotion level  PARTICIPATION LIMITATIONS: meal prep, cleaning, laundry, and shopping  PERSONAL FACTORS: Age, Time since onset of injury/illness/exacerbation, and 3+ comorbidities: Diabetes type 1, hypertension, rheumatoid arthritis, current smoker, anxiety, congestive heart failure, depression, neuropathy, and unstable angina  are also affecting patient's functional outcome.   REHAB POTENTIAL: Fair    CLINICAL DECISION MAKING: Evolving/moderate complexity  EVALUATION COMPLEXITY: Moderate   GOALS: Goals reviewed with patient? Yes  SHORT TERM GOALS: Target date: 09/03/23 Patient will be independent with her initial HEP.  Baseline: Goal status: INITIAL  2.  Patient will improve her five time sit to stand time to 23 seconds or less for improved lower extremity power.  Baseline:  Goal status: INITIAL  3.  Patient will improve her timed up and go time to 23 seconds or less for improved functional mobility.  Baseline:  Goal status: INITIAL  LONG TERM GOALS: Target date: 09/24/23  Patient will be independent with her advanced HEP.  Baseline:  Goal status: INITIAL  2.  Patient will improve her five time sit to stand time to 13 seconds or less to reduce her fall risk.  Baseline:  Goal status: INITIAL  3.  Patient  will improve her timed up and go time to 12 seconds or less to reduce her fall risk.  Baseline:  Goal status: INITIAL  4.  Patient will be able to transfer from sitting to standing without upper extremity support for improved independence with household mobility.  Baseline:  Goal status: INITIAL  PLAN:  PT FREQUENCY: 2x/week  PT DURATION: 6 weeks  PLANNED INTERVENTIONS: 97164- PT Re-evaluation, 97110-Therapeutic exercises, 97530- Therapeutic activity, 97112- Neuromuscular re-education, 97535- Self Care, 69629- Manual therapy, [phone]- Gait training, Patient/Family education, Balance training, and Stair training  PLAN FOR NEXT SESSION: Nustep, lower extremity strengthening, gait training, and balance interventions    Granville Lewis, PT 08/13/2023, 4:17 PM

## 2023-08-15 ENCOUNTER — Ambulatory Visit: Payer: Medicare Other

## 2023-08-20 ENCOUNTER — Ambulatory Visit: Payer: Medicare Other

## 2023-08-20 DIAGNOSIS — M6281 Muscle weakness (generalized): Secondary | ICD-10-CM | POA: Diagnosis not present

## 2023-08-20 DIAGNOSIS — R2681 Unsteadiness on feet: Secondary | ICD-10-CM

## 2023-08-20 NOTE — Therapy (Signed)
OUTPATIENT PHYSICAL THERAPY LOWER EXTREMITY TREATMENT   Patient Name: Nichole Santiago MRN: [id number] DOB:11-02-1950, 72 y.o., female Today's Date: 08/20/2023  END OF SESSION:  PT End of Session - 08/20/23 1443     Visit Number 2    Number of Visits 12    Date for PT Re-Evaluation 11/09/23    PT Start Time 1434    PT Stop Time 1501    PT Time Calculation (min) 27 min    Activity Tolerance Patient tolerated treatment well    Behavior During Therapy WFL for tasks assessed/performed             Past Medical History:  Diagnosis Date   Anxiety    CAD (coronary artery disease)    Cardiac tamponade    Cardiac/pericardial tamponade    Cerebral aneurysm    CHF (congestive heart failure) (HCC)    Depression    Diabetes mellitus without complication (HCC)    Type 1   Dyspnea    with exertion   Foot ulcer (HCC)    GERD (gastroesophageal reflux disease)    Hepatitis    Hepatitis C - was on a clinical trial and no longer has it   Hypertension    Insulin pump in place    Neuromuscular disorder (HCC)    neuropathy in feet and legs   Osteomyelitis of ankle or foot, acute, left (HCC)    RA (rheumatoid arthritis) (HCC)    Unstable angina (HCC)    2015   Past Surgical History:  Procedure Laterality Date   BRONCHIAL BIOPSY  06/05/2022   Procedure: BRONCHIAL BIOPSIES;  Surgeon: Leslye Peer, MD;  Location: [facility] ENDOSCOPY;  Service: Pulmonary;;   BRONCHIAL BRUSHINGS  06/05/2022   Procedure: BRONCHIAL BRUSHINGS;  Surgeon: Leslye Peer, MD;  Location: [facility] ENDOSCOPY;  Service: Pulmonary;;   BRONCHIAL NEEDLE ASPIRATION BIOPSY  06/05/2022   Procedure: BRONCHIAL NEEDLE ASPIRATION BIOPSIES;  Surgeon: Leslye Peer, MD;  Location: [facility] ENDOSCOPY;  Service: Pulmonary;;   BRONCHIAL WASHINGS  06/05/2022   Procedure: BRONCHIAL WASHINGS;  Surgeon: Leslye Peer, MD;  Location: [facility] ENDOSCOPY;  Service: Pulmonary;;   CARDIAC CATHETERIZATION     stent placed 2015   CEREBRAL ANEURYSM REPAIR   2017   COLONOSCOPY     PARTIAL HYSTERECTOMY     TEE WITHOUT CARDIOVERSION N/A 12/20/2021   Procedure: TRANSESOPHAGEAL ECHOCARDIOGRAM (TEE);  Surgeon: Thurmon Fair, MD;  Location: [facility] ENDOSCOPY;  Service: Cardiovascular;  Laterality: N/A;   TONSILLECTOMY     VIDEO BRONCHOSCOPY WITH RADIAL ENDOBRONCHIAL ULTRASOUND  06/05/2022   Procedure: VIDEO BRONCHOSCOPY WITH RADIAL ENDOBRONCHIAL ULTRASOUND;  Surgeon: Leslye Peer, MD;  Location: [facility] ENDOSCOPY;  Service: Pulmonary;;   Patient Active Problem List   Diagnosis Date Noted   Tobacco use 06/01/2022   Atherosclerosis 12/20/2021   Septic embolism (HCC)    Thrombocytopenia (HCC) 12/19/2021   Multiple pulmonary nodules 12/16/2021   Liver lesion, right lobe 12/16/2021   Dehydration 12/16/2021   Diabetes mellitus without complication (HCC)    RA (rheumatoid arthritis) (HCC)    HTN (hypertension)    HLD (hyperlipidemia)    Abdominal pain 12/15/2021   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   PERTINENT HISTORY: Diabetes type 1, hypertension, rheumatoid arthritis, current smoker, anxiety, congestive heart failure, depression, neuropathy, and unstable angina PAIN:  Are you having pain? No  PRECAUTIONS: Fall  RED FLAGS: None   WEIGHT BEARING RESTRICTIONS: No  FALLS:  Has patient fallen in last 6 months? Yes. Number of falls 1  LIVING ENVIRONMENT: Lives with: lives with their family Lives in: House/apartment Stairs: No Has following equipment at home: None  OCCUPATION: retired  PLOF: Independent with basic  ADLs  PATIENT GOALS: improved safety  NEXT MD VISIT: None scheduled  OBJECTIVE:  Note: Objective measures were completed at Evaluation unless otherwise noted.  COGNITION: Overall cognitive status: Within functional limits for tasks assessed     SENSATION: Light touch: WFL and no significant deficits bilaterally Patient reports numbness in both legs with the right leg more than the left.   EDEMA:  No edema observed  LOWER EXTREMITY ROM: WFL for activities assessed  LOWER EXTREMITY MMT:  MMT Right eval Left eval  Hip flexion 3+/5 3+/5  Hip extension    Hip abduction    Hip adduction    Hip internal rotation    Hip external rotation    Knee flexion 4-/5 4-/5  Knee extension 4+/5 4+/5  Ankle dorsiflexion 4-/5 4-/5  Ankle plantarflexion    Ankle inversion    Ankle eversion     (Blank rows = not tested)  FUNCTIONAL TESTS:  5 times sit to stand: 32.83 seconds with upper extremity support from armrests Timed up and go (TUG): 34.83 seconds with close supervision Required upper extremity support for sit to stand and stand to sit transfers  GAIT: Assistive device utilized: None Level of assistance: CGA Comments: poor foot clearance and stride length bilaterally   TODAY'S TREATMENT:                                                                                                                              DATE:                    08/20/23                  EXERCISE LOG  Exercise Repetitions and Resistance Comments  LAQs 2# x 15 reps bil   Seated Marches 2# x 15 reps bil   Seated Hip Abduction Red 3 mins   Seated Hip Adduction  Seated Ham Curls Red x 15 reps bil    Blank cell = exercise not performed today   PATIENT EDUCATION:  Education details: plan of care, safety, using an assistive device, prognosis, and goals for therapy Person educated: Patient and Child(ren) Education method: Explanation Education comprehension: verbalized understanding  HOME EXERCISE PROGRAM:   ASSESSMENT:  CLINICAL IMPRESSION: Pt arrives for today's treatment session denying any pain, but does report increased fatigue today.  Due to increased fatigue all exercises performed while seated today.  Pt requiring min cues for proper technique and posture with all newly added exercises.  Pt given seated rest breaks as needed due to fatigue.  Pt requiring use of wheelchair to make it back to her car.  Pt denied any pain at  completion of today's treatment session, but does report increased fatigue.   OBJECTIVE IMPAIRMENTS: Abnormal gait, decreased activity tolerance, decreased balance, decreased mobility, difficulty walking, decreased strength, and postural dysfunction.   ACTIVITY LIMITATIONS: carrying, standing, transfers, and locomotion level  PARTICIPATION LIMITATIONS: meal prep, cleaning, laundry, and shopping  PERSONAL FACTORS: Age, Time since onset of injury/illness/exacerbation, and 3+ comorbidities: Diabetes type 1, hypertension, rheumatoid arthritis, current smoker, anxiety, congestive heart failure, depression, neuropathy, and unstable angina  are also affecting patient's functional outcome.   REHAB POTENTIAL: Fair    CLINICAL DECISION MAKING: Evolving/moderate complexity  EVALUATION COMPLEXITY: Moderate   GOALS: Goals reviewed with patient? Yes  SHORT TERM GOALS: Target date: 09/03/23 Patient will be independent with her initial HEP.  Baseline: Goal status: INITIAL  2.  Patient will improve her five time sit to stand time to 23 seconds or less for improved lower extremity power.  Baseline:  Goal status: INITIAL  3.  Patient will improve her timed up and go time to 23 seconds or less for improved functional mobility.  Baseline:  Goal status: INITIAL  LONG TERM GOALS: Target date: 09/24/23  Patient will be independent with her advanced HEP.  Baseline:  Goal status: INITIAL  2.  Patient will improve her five time sit to stand time to 13 seconds or less to reduce her fall risk.  Baseline:  Goal status: INITIAL  3.  Patient will improve her timed up and go time to 12 seconds or less to reduce her fall risk.  Baseline:  Goal status: INITIAL  4.  Patient will be able to transfer from sitting to standing without upper extremity support for improved independence with household mobility.  Baseline:  Goal status: INITIAL  PLAN:  PT FREQUENCY: 2x/week  PT DURATION: 6 weeks  PLANNED  INTERVENTIONS: 97164- PT Re-evaluation, 97110-Therapeutic exercises, 97530- Therapeutic activity, 97112- Neuromuscular re-education, 97535- Self Care, 19147- Manual therapy, [id number]- Gait training, Patient/Family education, Balance training, and Stair training  PLAN FOR NEXT SESSION: Nustep, lower extremity strengthening, gait training, and balance interventions    Newman Pies, PTA 08/20/2023, 3:18 PM

## 2023-08-23 ENCOUNTER — Ambulatory Visit: Payer: Medicare Other | Admitting: Physical Therapy

## 2023-08-23 DIAGNOSIS — R2681 Unsteadiness on feet: Secondary | ICD-10-CM

## 2023-08-23 DIAGNOSIS — M6281 Muscle weakness (generalized): Secondary | ICD-10-CM | POA: Diagnosis not present

## 2023-08-23 NOTE — Therapy (Signed)
OUTPATIENT PHYSICAL THERAPY LOWER EXTREMITY TREATMENT   Patient Name: Nichole Santiago MRN: [id number] DOB:1951/08/31, 72 y.o., female Today's Date: 08/23/2023  END OF SESSION:  PT End of Session - 08/23/23 1619     Visit Number 3    Number of Visits 12    Date for PT Re-Evaluation 11/09/23    PT Start Time 1600    PT Stop Time 1625    PT Time Calculation (min) 25 min    Activity Tolerance Patient tolerated treatment well    Behavior During Therapy WFL for tasks assessed/performed              Past Medical History:  Diagnosis Date   Anxiety    CAD (coronary artery disease)    Cardiac tamponade    Cardiac/pericardial tamponade    Cerebral aneurysm    CHF (congestive heart failure) (HCC)    Depression    Diabetes mellitus without complication (HCC)    Type 1   Dyspnea    with exertion   Foot ulcer (HCC)    GERD (gastroesophageal reflux disease)    Hepatitis    Hepatitis C - was on a clinical trial and no longer has it   Hypertension    Insulin pump in place    Neuromuscular disorder (HCC)    neuropathy in feet and legs   Osteomyelitis of ankle or foot, acute, left (HCC)    RA (rheumatoid arthritis) (HCC)    Unstable angina (HCC)    2015   Past Surgical History:  Procedure Laterality Date   BRONCHIAL BIOPSY  06/05/2022   Procedure: BRONCHIAL BIOPSIES;  Surgeon: Leslye Peer, MD;  Location: [facility] ENDOSCOPY;  Service: Pulmonary;;   BRONCHIAL BRUSHINGS  06/05/2022   Procedure: BRONCHIAL BRUSHINGS;  Surgeon: Leslye Peer, MD;  Location: [facility] ENDOSCOPY;  Service: Pulmonary;;   BRONCHIAL NEEDLE ASPIRATION BIOPSY  06/05/2022   Procedure: BRONCHIAL NEEDLE ASPIRATION BIOPSIES;  Surgeon: Leslye Peer, MD;  Location: [facility] ENDOSCOPY;  Service: Pulmonary;;   BRONCHIAL WASHINGS  06/05/2022   Procedure: BRONCHIAL WASHINGS;  Surgeon: Leslye Peer, MD;  Location: [facility] ENDOSCOPY;  Service: Pulmonary;;   CARDIAC CATHETERIZATION     stent placed 2015   CEREBRAL ANEURYSM REPAIR   2017   COLONOSCOPY     PARTIAL HYSTERECTOMY     TEE WITHOUT CARDIOVERSION N/A 12/20/2021   Procedure: TRANSESOPHAGEAL ECHOCARDIOGRAM (TEE);  Surgeon: Thurmon Fair, MD;  Location: [facility] ENDOSCOPY;  Service: Cardiovascular;  Laterality: N/A;   TONSILLECTOMY     VIDEO BRONCHOSCOPY WITH RADIAL ENDOBRONCHIAL ULTRASOUND  06/05/2022   Procedure: VIDEO BRONCHOSCOPY WITH RADIAL ENDOBRONCHIAL ULTRASOUND;  Surgeon: Leslye Peer, MD;  Location: [facility] ENDOSCOPY;  Service: Pulmonary;;   Patient Active Problem List   Diagnosis Date Noted   Tobacco use 06/01/2022   Atherosclerosis 12/20/2021   Septic embolism (HCC)    Thrombocytopenia (HCC) 12/19/2021   Multiple pulmonary nodules 12/16/2021   Liver lesion, right lobe 12/16/2021   Dehydration 12/16/2021   Diabetes mellitus without complication (HCC)    RA (rheumatoid arthritis) (HCC)    HTN (hypertension)    HLD (hyperlipidemia)    Abdominal pain 12/15/2021   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 She felt good after her last appointment.   PERTINENT HISTORY: Diabetes type 1, hypertension, rheumatoid arthritis, current smoker, anxiety, congestive heart failure, depression, neuropathy, and unstable angina PAIN:  Are you having pain? No  PRECAUTIONS: Fall  RED FLAGS: None   WEIGHT BEARING RESTRICTIONS: No  FALLS:  Has patient fallen in last 6 months? Yes. Number of falls 1  LIVING ENVIRONMENT: Lives with: lives with their family Lives in: House/apartment Stairs: No Has following equipment at home: None  OCCUPATION: retired  PLOF:  Independent with basic ADLs  PATIENT GOALS: improved safety  NEXT MD VISIT: None scheduled  OBJECTIVE:  Note: Objective measures were completed at Evaluation unless otherwise noted.  COGNITION: Overall cognitive status: Within functional limits for tasks assessed     SENSATION: Light touch: WFL and no significant deficits bilaterally Patient reports numbness in both legs with the right leg more than the left.   EDEMA:  No edema observed  LOWER EXTREMITY ROM: WFL for activities assessed  LOWER EXTREMITY MMT:  MMT Right eval Left eval  Hip flexion 3+/5 3+/5  Hip extension    Hip abduction    Hip adduction    Hip internal rotation    Hip external rotation    Knee flexion 4-/5 4-/5  Knee extension 4+/5 4+/5  Ankle dorsiflexion 4-/5 4-/5  Ankle plantarflexion    Ankle inversion    Ankle eversion     (Blank rows = not tested)  FUNCTIONAL TESTS:  5 times sit to stand: 32.83 seconds with upper extremity support from armrests Timed up and go (TUG): 34.83 seconds with close supervision Required upper extremity support for sit to stand and stand to sit transfers  GAIT: Assistive device utilized: None Level of assistance: CGA Comments: poor foot clearance and stride length bilaterally   TODAY'S TREATMENT:                                                                                                                              DATE:                                     08/23/23 EXERCISE LOG  Exercise Repetitions and Resistance Comments  LAQ 20 reps each    Seated marching  20 reps    Seated hip ADD isometric  2 minutes w/ 5 second hold    Seated HS curl  Yellow t-band x 2 minutes each    Seated hip ABD isometric  15 reps w/ 5 second hold each    Static stance 2 minutes     Blank cell = exercise not performed today                   08/20/23                  EXERCISE LOG  Exercise Repetitions and Resistance Comments  LAQs 2# x 15 reps bil   Seated Marches 2# x  15 reps bil   Seated Hip Abduction Red 3 mins   Seated Hip Adduction  Seated Ham Curls Red x 15 reps bil    Blank cell = exercise not performed today   PATIENT EDUCATION:  Education details: plan of care, safety, using an assistive device, prognosis, and goals for therapy Person educated: Patient and Child(ren) Education method: Explanation Education comprehension: verbalized understanding  HOME EXERCISE  PROGRAM:   ASSESSMENT:  CLINICAL IMPRESSION: Today's treatment focused on familiar interventions for improved lower extremity strength with moderate difficulty. She required multimodal cueing throughout treatment to maintain proper exercise performance. She began to feel nauseous with standing and she requested to leave early. Recommend that she continue skilled physical therapy to address her remaining impairments to maximize her safety and functional mobility.   OBJECTIVE IMPAIRMENTS: Abnormal gait, decreased activity tolerance, decreased balance, decreased mobility, difficulty walking, decreased strength, and postural dysfunction.   ACTIVITY LIMITATIONS: carrying, standing, transfers, and locomotion level  PARTICIPATION LIMITATIONS: meal prep, cleaning, laundry, and shopping  PERSONAL FACTORS: Age, Time since onset of injury/illness/exacerbation, and 3+ comorbidities: Diabetes type 1, hypertension, rheumatoid arthritis, current smoker, anxiety, congestive heart failure, depression, neuropathy, and unstable angina  are also affecting patient's functional outcome.   REHAB POTENTIAL: Fair    CLINICAL DECISION MAKING: Evolving/moderate complexity  EVALUATION COMPLEXITY: Moderate   GOALS: Goals reviewed with patient? Yes  SHORT TERM GOALS: Target date: 09/03/23 Patient will be independent with her initial HEP.  Baseline: Goal status: INITIAL  2.  Patient will improve her five time sit to stand time to 23 seconds or less for improved lower extremity power.  Baseline:  Goal status: INITIAL  3.  Patient will improve her timed up and go time to 23 seconds or less for improved functional mobility.  Baseline:  Goal status: INITIAL  LONG TERM GOALS: Target date: 09/24/23  Patient will be independent with her advanced HEP.  Baseline:  Goal status: INITIAL  2.  Patient will improve her five time sit to stand time to 13 seconds or less to reduce her fall risk.  Baseline:  Goal  status: INITIAL  3.  Patient will improve her timed up and go time to 12 seconds or less to reduce her fall risk.  Baseline:  Goal status: INITIAL  4.  Patient will be able to transfer from sitting to standing without upper extremity support for improved independence with household mobility.  Baseline:  Goal status: INITIAL  PLAN:  PT FREQUENCY: 2x/week  PT DURATION: 6 weeks  PLANNED INTERVENTIONS: 97164- PT Re-evaluation, 97110-Therapeutic exercises, 97530- Therapeutic activity, 97112- Neuromuscular re-education, 97535- Self Care, 24401- Manual therapy, [id number]- Gait training, Patient/Family education, Balance training, and Stair training  PLAN FOR NEXT SESSION: Nustep, lower extremity strengthening, gait training, and balance interventions    Granville Lewis, PT 08/23/2023, 5:30 PM

## 2023-08-27 ENCOUNTER — Ambulatory Visit: Payer: Medicare Other

## 2023-11-08 ENCOUNTER — Encounter: Payer: Self-pay | Admitting: Emergency Medicine
